# Patient Record
Sex: Female | Born: 1980 | Race: Black or African American | Hispanic: No | Marital: Single | Smoking: Never smoker
Health system: Southern US, Community
[De-identification: ages and names within clinical notes are randomized; demographics above are authoritative.]

## PROBLEM LIST (undated history)

## (undated) DIAGNOSIS — U071 COVID-19: Secondary | ICD-10-CM

## (undated) DIAGNOSIS — Z8489 Family history of other specified conditions: Secondary | ICD-10-CM

## (undated) DIAGNOSIS — R519 Headache, unspecified: Secondary | ICD-10-CM

## (undated) DIAGNOSIS — G4730 Sleep apnea, unspecified: Secondary | ICD-10-CM

## (undated) DIAGNOSIS — D219 Benign neoplasm of connective and other soft tissue, unspecified: Secondary | ICD-10-CM

## (undated) DIAGNOSIS — O24419 Gestational diabetes mellitus in pregnancy, unspecified control: Secondary | ICD-10-CM

## (undated) HISTORY — PX: BREAST FIBROADENOMA SURGERY: SHX580

## (undated) HISTORY — PX: DILATION AND CURETTAGE OF UTERUS: SHX78

## (undated) HISTORY — PX: LASIK: SHX215

## (undated) HISTORY — DX: Benign neoplasm of connective and other soft tissue, unspecified: D21.9

---

## 2002-04-05 HISTORY — PX: DILATION AND CURETTAGE OF UTERUS: SHX78

## 2016-04-05 HISTORY — PX: LASIK: SHX215

## 2018-04-05 DIAGNOSIS — O24419 Gestational diabetes mellitus in pregnancy, unspecified control: Secondary | ICD-10-CM

## 2018-04-05 HISTORY — DX: Gestational diabetes mellitus in pregnancy, unspecified control: O24.419

## 2018-04-19 LAB — OB RESULTS CONSOLE ABO/RH: RH Type: POSITIVE

## 2018-04-19 LAB — OB RESULTS CONSOLE HEPATITIS B SURFACE ANTIGEN: Hepatitis B Surface Ag: NEGATIVE

## 2018-04-19 LAB — OB RESULTS CONSOLE ANTIBODY SCREEN: Antibody Screen: NEGATIVE

## 2018-04-19 LAB — OB RESULTS CONSOLE GC/CHLAMYDIA
Chlamydia: NEGATIVE
Gonorrhea: NEGATIVE

## 2018-04-19 LAB — OB RESULTS CONSOLE HIV ANTIBODY (ROUTINE TESTING): HIV: NONREACTIVE

## 2018-04-19 LAB — OB RESULTS CONSOLE RUBELLA ANTIBODY, IGM: Rubella: IMMUNE

## 2018-04-19 LAB — OB RESULTS CONSOLE RPR: RPR: NONREACTIVE

## 2018-09-14 ENCOUNTER — Encounter: Payer: BC Managed Care – PPO | Attending: Certified Nurse Midwife | Admitting: Registered"

## 2018-09-14 ENCOUNTER — Other Ambulatory Visit: Payer: Self-pay

## 2018-09-14 ENCOUNTER — Encounter: Payer: Self-pay | Admitting: Registered"

## 2018-09-14 DIAGNOSIS — O24419 Gestational diabetes mellitus in pregnancy, unspecified control: Secondary | ICD-10-CM | POA: Insufficient documentation

## 2018-09-14 NOTE — Progress Notes (Signed)
Patient was seen on 09/14/2018 for Gestational Diabetes self-management education at the [facility]. The following learning objectives were met by the patient during this course:   States the definition of Gestational Diabetes  States why dietary management is important in controlling blood glucose  Describes the effects each nutrient has on blood glucose levels  Demonstrates ability to create a balanced meal plan  Demonstrates carbohydrate counting   States when to check blood glucose levels  Demonstrates proper blood glucose monitoring techniques  States the effect of stress and exercise on blood glucose levels  States the importance of limiting caffeine and abstaining from alcohol and smoking  Blood glucose monitor given: none  Patient instructed to monitor glucose levels: FBS: 60 - <95; 1 hour: <140; 2 hour: <120  Patient received handouts:  Nutrition Diabetes and Pregnancy, including carb counting list  Patient will be seen for follow-up as needed.

## 2018-09-15 ENCOUNTER — Encounter (HOSPITAL_COMMUNITY): Payer: Self-pay | Admitting: *Deleted

## 2018-09-15 ENCOUNTER — Inpatient Hospital Stay (HOSPITAL_COMMUNITY)
Admission: AD | Admit: 2018-09-15 | Discharge: 2018-09-15 | Disposition: A | Discharge status: Home / Self Care | Payer: BC Managed Care – PPO | Attending: Obstetrics & Gynecology | Admitting: Obstetrics & Gynecology

## 2018-09-15 ENCOUNTER — Other Ambulatory Visit: Payer: Self-pay

## 2018-09-15 DIAGNOSIS — Y9301 Activity, walking, marching and hiking: Secondary | ICD-10-CM | POA: Insufficient documentation

## 2018-09-15 DIAGNOSIS — O24410 Gestational diabetes mellitus in pregnancy, diet controlled: Secondary | ICD-10-CM | POA: Insufficient documentation

## 2018-09-15 DIAGNOSIS — Z91018 Allergy to other foods: Secondary | ICD-10-CM | POA: Diagnosis not present

## 2018-09-15 DIAGNOSIS — O9A219 Injury, poisoning and certain other consequences of external causes complicating pregnancy, unspecified trimester: Secondary | ICD-10-CM | POA: Diagnosis present

## 2018-09-15 DIAGNOSIS — W108XXA Fall (on) (from) other stairs and steps, initial encounter: Secondary | ICD-10-CM | POA: Diagnosis not present

## 2018-09-15 DIAGNOSIS — O9A213 Injury, poisoning and certain other consequences of external causes complicating pregnancy, third trimester: Secondary | ICD-10-CM

## 2018-09-15 DIAGNOSIS — Z91013 Allergy to seafood: Secondary | ICD-10-CM | POA: Diagnosis not present

## 2018-09-15 DIAGNOSIS — Z3A31 31 weeks gestation of pregnancy: Secondary | ICD-10-CM | POA: Diagnosis not present

## 2018-09-15 HISTORY — DX: Gestational diabetes mellitus in pregnancy, unspecified control: O24.419

## 2018-09-15 LAB — URINALYSIS, ROUTINE W REFLEX MICROSCOPIC
Bilirubin Urine: NEGATIVE
Glucose, UA: NEGATIVE mg/dL
Hgb urine dipstick: NEGATIVE
Ketones, ur: NEGATIVE mg/dL
Leukocytes,Ua: NEGATIVE
Nitrite: NEGATIVE
Protein, ur: NEGATIVE mg/dL
Specific Gravity, Urine: 1.027 (ref 1.005–1.030)
pH: 5 (ref 5.0–8.0)

## 2018-09-15 NOTE — Discharge Instructions (Signed)
You can take Tylenol 1000 mg by mouth every 6 hours as you need it for pain.

## 2018-09-15 NOTE — MAU Provider Note (Addendum)
History     CSN: [id number]  Arrival date and time: 09/15/18 1747   First Provider Initiated Contact with Patient 09/15/18 1846      Chief Complaint  Patient presents with  . Fall   Vickie Anderson is a 38 y.o. G1P0 at [redacted]w[redacted]d who presents today after a fall. She was walking and missed a step, and she landed on hard floor on her buttocks. She denies any VB, LOF or contractions since the fall. She reports normal fetal movement. She denies any complications with the pregnancy other than diet controlled GDM. Slight headache since the fall. She thinks this is from stress.   Fall The accident occurred 1 to 3 hours ago. The fall occurred while walking. She fell from a height of 3 to 5 ft. She landed on hard floor. There was no blood loss. The point of impact was the buttocks. The patient is experiencing no pain. Associated symptoms include headaches. Pertinent negatives include no fever, nausea or vomiting.    OB History    Gravida  1   Para      Term      Preterm      AB      Living        SAB      TAB      Ectopic      Multiple      Live Births              Past Medical History:  Diagnosis Date  . Gestational diabetes     Past Surgical History:  Procedure Laterality Date  . LASIK      History reviewed. No pertinent family history.  Social History   Tobacco Use  . Smoking status: Never Smoker  . Smokeless tobacco: Never Used  Substance Use Topics  . Alcohol use: Not Currently    Frequency: Never  . Drug use: Never    Allergies:  Allergies  Allergen Reactions  . Other Shortness Of Breath    Tree  Nuts and Peanuts  . Shellfish Allergy Diarrhea and Rash    No medications prior to admission.    Review of Systems  Constitutional: Negative for chills and fever.  Gastrointestinal: Negative for nausea and vomiting.  Genitourinary: Negative for pelvic pain, vaginal bleeding and vaginal discharge.  Neurological: Positive for headaches.   Physical  Exam   Blood pressure 124/61, pulse 90, temperature 98.1 F (36.7 C), temperature source Oral, resp. rate 18, height 5' 8.75" (1.746 m), weight 131.6 kg, SpO2 99 %.  Physical Exam  Nursing note and vitals reviewed. Constitutional: She is oriented to person, place, and time. She appears well-developed and well-nourished. No distress.  HENT:  Head: Normocephalic.  Cardiovascular: Normal rate.  Respiratory: Effort normal.  GI: Soft. There is no abdominal tenderness. There is no rebound.  Musculoskeletal:        General: No tenderness.  Neurological: She is oriented to person, place, and time.  Skin: Skin is warm and dry.  Psychiatric: She has a normal mood and affect.    NST:  Baseline: 145 Variability: moderate Accels: 15x15 Decels: none Toco: rare, patient not feeling -- none prior to d/c home  MAU Course  Procedures  MDM CCUA Prolonged NST Regular diet 7:52 PM care turned over Laury Deep, CNM  Marcille Buffy DNP, CNM  09/15/18  7:53 PM   2030: Patient feeling much better after eating dinner, feeling good (+) FM, no complaints of UC's or abdominal pain  2045: Ready to go home  Results for orders placed or performed during the hospital encounter of 09/15/18 (from the past 24 hour(s))  Urinalysis, Routine w reflex microscopic     Status: Abnormal   Collection Time: 09/15/18  7:48 PM  Result Value Ref Range   Color, Urine YELLOW YELLOW   APPearance HAZY (A) CLEAR   Specific Gravity, Urine 1.027 1.005 - 1.030   pH 5.0 5.0 - 8.0   Glucose, UA NEGATIVE NEGATIVE mg/dL   Hgb urine dipstick NEGATIVE NEGATIVE   Bilirubin Urine NEGATIVE NEGATIVE   Ketones, ur NEGATIVE NEGATIVE mg/dL   Protein, ur NEGATIVE NEGATIVE mg/dL   Nitrite NEGATIVE NEGATIVE   Leukocytes,Ua NEGATIVE NEGATIVE    Assessment and Plan  Traumatic injury during pregnancy in third trimester  - Plan: Discharge patient - Reviewed bleeding precautions - Keep scheduled appt with [facility] on  09/18/18   Laury Deep, CNM  09/15/2018 11:15 PM

## 2018-09-15 NOTE — MAU Note (Signed)
Slipped on steps, landed on butt (1630-2 steps).  No pain, bleeding or leaking.  +FM

## 2018-10-20 LAB — OB RESULTS CONSOLE GBS: GBS: POSITIVE

## 2018-10-27 ENCOUNTER — Other Ambulatory Visit: Payer: Self-pay

## 2018-10-30 ENCOUNTER — Telehealth (HOSPITAL_COMMUNITY): Payer: Self-pay | Admitting: *Deleted

## 2018-10-30 ENCOUNTER — Encounter (HOSPITAL_COMMUNITY): Payer: Self-pay

## 2018-10-30 ENCOUNTER — Encounter (HOSPITAL_COMMUNITY): Payer: Self-pay | Admitting: *Deleted

## 2018-10-30 NOTE — Telephone Encounter (Signed)
Preadmission screen  

## 2018-10-31 ENCOUNTER — Other Ambulatory Visit: Payer: Self-pay

## 2018-10-31 ENCOUNTER — Inpatient Hospital Stay (HOSPITAL_COMMUNITY)
Admission: AD | Admit: 2018-10-31 | Discharge: 2018-10-31 | Disposition: A | Discharge status: Home / Self Care | Payer: BC Managed Care – PPO | Attending: Obstetrics | Admitting: Obstetrics

## 2018-10-31 ENCOUNTER — Encounter (HOSPITAL_COMMUNITY): Payer: Self-pay

## 2018-10-31 DIAGNOSIS — O368320 Maternal care for abnormalities of the fetal heart rate or rhythm, second trimester, not applicable or unspecified: Secondary | ICD-10-CM | POA: Diagnosis not present

## 2018-10-31 DIAGNOSIS — O09523 Supervision of elderly multigravida, third trimester: Secondary | ICD-10-CM | POA: Insufficient documentation

## 2018-10-31 DIAGNOSIS — O368330 Maternal care for abnormalities of the fetal heart rate or rhythm, third trimester, not applicable or unspecified: Secondary | ICD-10-CM | POA: Diagnosis present

## 2018-10-31 DIAGNOSIS — Z3689 Encounter for other specified antenatal screening: Secondary | ICD-10-CM

## 2018-10-31 DIAGNOSIS — Z3A37 37 weeks gestation of pregnancy: Secondary | ICD-10-CM | POA: Diagnosis not present

## 2018-10-31 LAB — URINALYSIS, ROUTINE W REFLEX MICROSCOPIC
Bilirubin Urine: NEGATIVE
Glucose, UA: NEGATIVE mg/dL
Hgb urine dipstick: NEGATIVE
Ketones, ur: 5 mg/dL — AB
Leukocytes,Ua: NEGATIVE
Nitrite: NEGATIVE
Protein, ur: NEGATIVE mg/dL
Specific Gravity, Urine: 1.015 (ref 1.005–1.030)
pH: 6 (ref 5.0–8.0)

## 2018-10-31 NOTE — MAU Provider Note (Signed)
History     CSN: [id number]  Arrival date and time: 10/31/18 1702   First Provider Initiated Contact with Patient 10/31/18 1922      Chief Complaint  Patient presents with  . Non-stress Test    told to come after NST in office for further evaluation of baby's heart rate   HPI   Vickie Anderson is a 38 y.o. female G2P0010 @ 104w6d here in MAU from the office. She was told that the babies heart rate was elevated during her NST and did not come down to baseline. She says she was very uncomfortable in the position she was on in the bed and wonders if that made an impact on the babies HR. She is feeling regular fetal movements, no pain. No bleeding.   OB History    Gravida  2   Para      Term      Preterm      AB  1   Living        SAB      TAB  1   Ectopic      Multiple      Live Births              Past Medical History:  Diagnosis Date  . Fibroid   . Gestational diabetes     Past Surgical History:  Procedure Laterality Date  . DILATION AND CURETTAGE OF UTERUS    . LASIK      Family History  Problem Relation Age of Onset  . Diabetes Mother   . Diabetes Father     Social History   Tobacco Use  . Smoking status: Never Smoker  . Smokeless tobacco: Never Used  Substance Use Topics  . Alcohol use: Not Currently    Frequency: Never  . Drug use: Never    Allergies:  Allergies  Allergen Reactions  . Other Shortness Of Breath    Tree  Nuts and Peanuts  . Shellfish Allergy Diarrhea and Rash    Medications Prior to Admission  Medication Sig Dispense Refill Last Dose  . fluticasone (FLONASE) 50 MCG/ACT nasal spray Place into both nostrils daily.   Past Week at Unknown time  . loratadine (CLARITIN) 10 MG tablet Take 10 mg by mouth daily.   10/30/2018 at Unknown time  . metFORMIN (GLUCOPHAGE) 500 MG tablet Take 500 mg by mouth 2 (two) times daily with a meal.   10/30/2018 at Unknown time  . metroNIDAZOLE (FLAGYL) 500 MG tablet Take 500 mg by  mouth 3 (three) times daily.   10/31/2018 at Unknown time  . Prenatal Vit-Fe Fumarate-FA (MULTIVITAMIN-PRENATAL) 27-0.8 MG TABS tablet Take 1 tablet by mouth daily at 12 noon.   10/30/2018 at Unknown time   Results for orders placed or performed during the hospital encounter of 10/31/18 (from the past 48 hour(s))  Urinalysis, Routine w reflex microscopic     Status: Abnormal   Collection Time: 10/31/18  6:14 PM  Result Value Ref Range   Color, Urine YELLOW YELLOW   APPearance HAZY (A) CLEAR   Specific Gravity, Urine 1.015 1.005 - 1.030   pH 6.0 5.0 - 8.0   Glucose, UA NEGATIVE NEGATIVE mg/dL   Hgb urine dipstick NEGATIVE NEGATIVE   Bilirubin Urine NEGATIVE NEGATIVE   Ketones, ur 5 (A) NEGATIVE mg/dL   Protein, ur NEGATIVE NEGATIVE mg/dL   Nitrite NEGATIVE NEGATIVE   Leukocytes,Ua NEGATIVE NEGATIVE    Comment: Performed at [facility], [address] [place], [place] [address]   Review of Systems  Gastrointestinal: Negative for abdominal pain.  Genitourinary: Negative for vaginal bleeding and vaginal discharge.   Physical Exam   Blood pressure 126/73, pulse 84, temperature 98 F (36.7 C), resp. rate 12.  Physical Exam  Constitutional: She is oriented to person, place, and time. She appears well-developed and well-nourished. No distress.  Musculoskeletal: Normal range of motion.  Neurological: She is alert and oriented to person, place, and time.  Skin: Skin is warm. She is not diaphoretic.  Psychiatric: Her behavior is normal.   Fetal Tracing: Baseline: 140 bpm Variability: Moderate  Accelerations: 15x15 Decelerations: None Toco: None  MAU Course  Procedures  None  MDM  Grossly reactive NST today with normal baseline.   Assessment and Plan   A:  1. NST (non-stress test) reactive   2. [redacted] weeks gestation of pregnancy     P:  Discharge home in stable condition NST reactive Message sent to the office for follow-up Fetal kick counts  Return to MAU if  symptoms worsen  Lora Glomski, Artist Pais, NP 10/31/2018 7:36 PM

## 2018-10-31 NOTE — Discharge Instructions (Signed)
Nonstress Test °A nonstress test is a procedure that is done during pregnancy in order to check the baby's heartbeat. The procedure can help show if the baby (fetus) is healthy. It is commonly done when: °· The baby is past his or her due date. °· The pregnancy is high risk. °· The baby is moving less than normal. °· The mother has lost a pregnancy in the past. °· The health care provider suspects a problem with the baby's growth. °· There is too much or too little amniotic fluid. °The procedure is often done in the third trimester of pregnancy to find out if an early delivery is needed and whether such a delivery is safe. °During a nonstress test, the baby's heartbeat is monitored when the baby is resting and when the baby is moving. If the baby is healthy, the heart rate will increase when he or she moves or kicks and will return to normal when he or she rests. °Tell a health care provider about: °· Any allergies you have. °· Any medical conditions you have. °· All medicines you are taking, including vitamins, herbs, eye drops, creams, and over-the-counter medicines. °What are the risks? °There are no risks to you or your baby from a nonstress test. This procedure should not be painful or uncomfortable. °What happens before the procedure? °· Eat a meal right before the test or as directed by your health care provider. Food may help encourage the baby to move. °· Use the restroom right before the test. °What happens during the procedure? °· Two monitors will be placed on your abdomen. One will record the baby's heart rate and the other will record the contractions of your uterus. °· You may be asked to lie down on your side or to sit upright. °· You may be given a button to press when you feel your baby move. °· Your health care provider will listen to your baby's heartbeat and recorded it. He or she may also watch your baby's heartbeat on a screen. °· If the baby seems to be sleeping, you may be asked to drink  some juice or soda, eat a snack, or change positions. °The procedure may vary among health care providers and hospitals. °What happens after the procedure? °· Your health care provider will discuss the test results with you and make recommendations for the future. Depending on the results, your health care provider may order additional tests or another course of action. °· If your health care provider gave you any diet or activity instructions, make sure to follow them. °· Keep all follow-up visits as told by your health care provider. This is important. °Summary °· A nonstress test is a procedure that is done during pregnancy in order to check the baby's heartbeat. The procedure can help show if the baby is healthy. °· The procedure is often done in the third trimester of pregnancy to find out if an early delivery is needed and whether such a delivery is safe. °· During a nonstress test, the baby's heartbeat is monitored when the baby is resting and when the baby is moving. If the baby is healthy, the heart rate will increase when he or she moves or kicks and will return to normal when he or she rests. °· Your health care provider will discuss the test results with you and make recommendations for the future. °This information is not intended to replace advice given to you by your health care provider. Make sure you discuss any   questions you have with your health care provider. Document Released: 03/12/2002 Document Revised: 07/01/2016 Document Reviewed: 07/01/2016 Elsevier Patient Education  [place].

## 2018-10-31 NOTE — MAU Note (Signed)
Pt states she was having an NST in the office that was scheduled and was told that her baby's heart rate was too high for too long. Pt states that during the NST the baby's heart rate was too hight the entire time.   Pt states she was given an u/s and her midwife did not really comment on the u/s just that she felt like it would be a good idea to come in and be evaluated further.   Reports +FM   Denies vaginal bleeding or LOF.

## 2018-11-03 ENCOUNTER — Other Ambulatory Visit: Payer: Self-pay

## 2018-11-03 ENCOUNTER — Ambulatory Visit (HOSPITAL_COMMUNITY)
Admission: RE | Admit: 2018-11-03 | Discharge: 2018-11-03 | Disposition: A | Discharge status: Home / Self Care | Payer: BC Managed Care – PPO | Source: Ambulatory Visit | Attending: Obstetrics and Gynecology | Admitting: Obstetrics and Gynecology

## 2018-11-03 ENCOUNTER — Encounter (HOSPITAL_COMMUNITY): Payer: Self-pay

## 2018-11-03 ENCOUNTER — Other Ambulatory Visit (HOSPITAL_COMMUNITY): Payer: Self-pay | Admitting: Obstetrics & Gynecology

## 2018-11-03 ENCOUNTER — Ambulatory Visit (HOSPITAL_COMMUNITY): Payer: BC Managed Care – PPO | Admitting: *Deleted

## 2018-11-03 DIAGNOSIS — O3413 Maternal care for benign tumor of corpus uteri, third trimester: Secondary | ICD-10-CM | POA: Diagnosis not present

## 2018-11-03 DIAGNOSIS — O9A213 Injury, poisoning and certain other consequences of external causes complicating pregnancy, third trimester: Secondary | ICD-10-CM

## 2018-11-03 DIAGNOSIS — O09523 Supervision of elderly multigravida, third trimester: Secondary | ICD-10-CM | POA: Diagnosis not present

## 2018-11-03 DIAGNOSIS — Z3A38 38 weeks gestation of pregnancy: Secondary | ICD-10-CM

## 2018-11-03 DIAGNOSIS — O4593 Premature separation of placenta, unspecified, third trimester: Secondary | ICD-10-CM

## 2018-11-03 DIAGNOSIS — D259 Leiomyoma of uterus, unspecified: Secondary | ICD-10-CM

## 2018-11-03 DIAGNOSIS — O24415 Gestational diabetes mellitus in pregnancy, controlled by oral hypoglycemic drugs: Secondary | ICD-10-CM

## 2018-11-08 ENCOUNTER — Other Ambulatory Visit: Payer: Self-pay

## 2018-11-08 ENCOUNTER — Other Ambulatory Visit (HOSPITAL_COMMUNITY)
Admission: RE | Admit: 2018-11-08 | Discharge: 2018-11-08 | Disposition: A | Discharge status: Home / Self Care | Payer: BC Managed Care – PPO | Source: Ambulatory Visit | Attending: Obstetrics and Gynecology | Admitting: Obstetrics and Gynecology

## 2018-11-08 DIAGNOSIS — Z01812 Encounter for preprocedural laboratory examination: Secondary | ICD-10-CM | POA: Diagnosis not present

## 2018-11-08 DIAGNOSIS — Z20828 Contact with and (suspected) exposure to other viral communicable diseases: Secondary | ICD-10-CM | POA: Insufficient documentation

## 2018-11-08 LAB — SARS CORONAVIRUS 2 (TAT 6-24 HRS): SARS Coronavirus 2: NEGATIVE

## 2018-11-08 NOTE — MAU Note (Signed)
Ovid swab collected.PT tolerated well.PT asymptomatic

## 2018-11-10 ENCOUNTER — Inpatient Hospital Stay (HOSPITAL_COMMUNITY): Payer: BC Managed Care – PPO | Admitting: Anesthesiology

## 2018-11-10 ENCOUNTER — Other Ambulatory Visit: Payer: Self-pay

## 2018-11-10 ENCOUNTER — Inpatient Hospital Stay (HOSPITAL_COMMUNITY)
Admission: AD | Admit: 2018-11-10 | Discharge: 2018-11-13 | DRG: 788 | Disposition: A | Discharge status: Home / Self Care | Payer: BC Managed Care – PPO | Attending: Obstetrics & Gynecology | Admitting: Obstetrics & Gynecology

## 2018-11-10 ENCOUNTER — Encounter (HOSPITAL_COMMUNITY): Payer: Self-pay | Admitting: *Deleted

## 2018-11-10 ENCOUNTER — Inpatient Hospital Stay (HOSPITAL_COMMUNITY): Payer: BC Managed Care – PPO

## 2018-11-10 ENCOUNTER — Encounter (HOSPITAL_COMMUNITY)
Admission: AD | Disposition: A | Discharge status: Home / Self Care | Payer: Self-pay | Source: Home / Self Care | Attending: Obstetrics & Gynecology

## 2018-11-10 DIAGNOSIS — O99214 Obesity complicating childbirth: Secondary | ICD-10-CM | POA: Diagnosis present

## 2018-11-10 DIAGNOSIS — D259 Leiomyoma of uterus, unspecified: Secondary | ICD-10-CM | POA: Diagnosis present

## 2018-11-10 DIAGNOSIS — O4593 Premature separation of placenta, unspecified, third trimester: Principal | ICD-10-CM | POA: Diagnosis present

## 2018-11-10 DIAGNOSIS — O3413 Maternal care for benign tumor of corpus uteri, third trimester: Secondary | ICD-10-CM | POA: Diagnosis present

## 2018-11-10 DIAGNOSIS — R339 Retention of urine, unspecified: Secondary | ICD-10-CM | POA: Diagnosis not present

## 2018-11-10 DIAGNOSIS — O9A213 Injury, poisoning and certain other consequences of external causes complicating pregnancy, third trimester: Secondary | ICD-10-CM

## 2018-11-10 DIAGNOSIS — O24425 Gestational diabetes mellitus in childbirth, controlled by oral hypoglycemic drugs: Secondary | ICD-10-CM | POA: Diagnosis present

## 2018-11-10 DIAGNOSIS — O99824 Streptococcus B carrier state complicating childbirth: Secondary | ICD-10-CM | POA: Diagnosis present

## 2018-11-10 DIAGNOSIS — O9089 Other complications of the puerperium, not elsewhere classified: Secondary | ICD-10-CM | POA: Diagnosis not present

## 2018-11-10 DIAGNOSIS — Z3A39 39 weeks gestation of pregnancy: Secondary | ICD-10-CM

## 2018-11-10 DIAGNOSIS — Z3490 Encounter for supervision of normal pregnancy, unspecified, unspecified trimester: Secondary | ICD-10-CM | POA: Diagnosis present

## 2018-11-10 DIAGNOSIS — O4590 Premature separation of placenta, unspecified, unspecified trimester: Secondary | ICD-10-CM | POA: Clinically undetermined

## 2018-11-10 LAB — CBC
HCT: 35.6 % — ABNORMAL LOW (ref 36.0–46.0)
Hemoglobin: 11.8 g/dL — ABNORMAL LOW (ref 12.0–15.0)
MCH: 29.1 pg (ref 26.0–34.0)
MCHC: 33.1 g/dL (ref 30.0–36.0)
MCV: 87.7 fL (ref 80.0–100.0)
Platelets: 288 10*3/uL (ref 150–400)
RBC: 4.06 MIL/uL (ref 3.87–5.11)
RDW: 13.7 % (ref 11.5–15.5)
WBC: 9.8 10*3/uL (ref 4.0–10.5)
nRBC: 0 % (ref 0.0–0.2)

## 2018-11-10 LAB — PREPARE RBC (CROSSMATCH)

## 2018-11-10 LAB — GLUCOSE, CAPILLARY
Glucose-Capillary: 84 mg/dL (ref 70–99)
Glucose-Capillary: 97 mg/dL (ref 70–99)
Glucose-Capillary: 124 mg/dL — ABNORMAL HIGH (ref 70–99)
Glucose-Capillary: 128 mg/dL — ABNORMAL HIGH (ref 70–99)
Glucose-Capillary: 84 mg/dL (ref 70–99)
Glucose-Capillary: 95 mg/dL (ref 70–99)

## 2018-11-10 LAB — RPR: RPR Ser Ql: NONREACTIVE

## 2018-11-10 SURGERY — Surgical Case
Anesthesia: Epidural | Site: Abdomen | Wound class: Clean Contaminated

## 2018-11-10 MED ORDER — FENTANYL CITRATE (PF) 100 MCG/2ML IJ SOLN: 50.0000 ug | INTRAMUSCULAR | Status: DC | PRN

## 2018-11-10 MED ORDER — LACTATED RINGERS IV SOLN
INTRAVENOUS | Status: DC
  Administered 2018-11-10: 01:00:00 via INTRAVENOUS

## 2018-11-10 MED ORDER — SOD CITRATE-CITRIC ACID 500-334 MG/5ML PO SOLN
30.0000 mL | ORAL | Status: DC | PRN
  Administered 2018-11-10: 30 mL via ORAL
  Filled 2018-11-10: qty 30

## 2018-11-10 MED ORDER — SODIUM CHLORIDE (PF) 0.9 % IJ SOLN
INTRAMUSCULAR | Status: DC | PRN
  Administered 2018-11-10: 12 mL/h via EPIDURAL

## 2018-11-10 MED ORDER — COCONUT OIL OIL: 1.0000 "application " | TOPICAL_OIL | Status: DC | PRN

## 2018-11-10 MED ORDER — STERILE WATER FOR IRRIGATION IR SOLN
Status: DC | PRN
  Administered 2018-11-10: 1000 mL

## 2018-11-10 MED ORDER — LACTATED RINGERS IV SOLN
INTRAVENOUS | Status: DC | PRN
  Administered 2018-11-10 (×2): via INTRAVENOUS

## 2018-11-10 MED ORDER — FENTANYL CITRATE (PF) 100 MCG/2ML IJ SOLN
INTRAMUSCULAR | Status: AC
  Filled 2018-11-10: qty 2

## 2018-11-10 MED ORDER — IBUPROFEN 800 MG PO TABS
800.0000 mg | ORAL_TABLET | Freq: Three times a day (TID) | ORAL | Status: DC
  Administered 2018-11-11 – 2018-11-13 (×7): 800 mg via ORAL
  Filled 2018-11-10 (×7): qty 1

## 2018-11-10 MED ORDER — MENTHOL 3 MG MT LOZG: 1.0000 | LOZENGE | OROMUCOSAL | Status: DC | PRN

## 2018-11-10 MED ORDER — DIPHENHYDRAMINE HCL 50 MG/ML IJ SOLN: 12.5000 mg | INTRAMUSCULAR | Status: DC | PRN

## 2018-11-10 MED ORDER — MISOPROSTOL 50MCG HALF TABLET
50.0000 ug | ORAL_TABLET | ORAL | Status: DC | PRN
  Administered 2018-11-10: 50 ug via BUCCAL
  Filled 2018-11-10: qty 1

## 2018-11-10 MED ORDER — METOCLOPRAMIDE HCL 5 MG/ML IJ SOLN
INTRAMUSCULAR | Status: DC | PRN
  Administered 2018-11-10: 10 mg via INTRAVENOUS

## 2018-11-10 MED ORDER — FENTANYL-BUPIVACAINE-NACL 0.5-0.125-0.9 MG/250ML-% EP SOLN
12.0000 mL/h | EPIDURAL | Status: DC | PRN
  Filled 2018-11-10: qty 250

## 2018-11-10 MED ORDER — NALOXONE HCL 0.4 MG/ML IJ SOLN: 0.4000 mg | INTRAMUSCULAR | Status: DC | PRN

## 2018-11-10 MED ORDER — SODIUM CHLORIDE 0.9 % IV SOLN
5.0000 10*6.[IU] | Freq: Once | INTRAVENOUS | Status: AC
  Administered 2018-11-10: 5 10*6.[IU] via INTRAVENOUS
  Filled 2018-11-10: qty 5

## 2018-11-10 MED ORDER — ONDANSETRON HCL 4 MG/2ML IJ SOLN: 4.0000 mg | Freq: Four times a day (QID) | INTRAMUSCULAR | Status: DC | PRN

## 2018-11-10 MED ORDER — LACTATED RINGERS IV SOLN
INTRAVENOUS | Status: DC
  Administered 2018-11-11: via INTRAVENOUS

## 2018-11-10 MED ORDER — MEPERIDINE HCL 25 MG/ML IJ SOLN: 6.2500 mg | INTRAMUSCULAR | Status: DC | PRN

## 2018-11-10 MED ORDER — DIPHENHYDRAMINE HCL 25 MG PO CAPS
25.0000 mg | ORAL_CAPSULE | Freq: Four times a day (QID) | ORAL | Status: DC | PRN
  Administered 2018-11-11: 25 mg via ORAL
  Filled 2018-11-10: qty 1

## 2018-11-10 MED ORDER — OXYTOCIN 40 UNITS IN NORMAL SALINE INFUSION - SIMPLE MED
INTRAVENOUS | Status: AC
  Filled 2018-11-10: qty 1000

## 2018-11-10 MED ORDER — PRENATAL MULTIVITAMIN CH
1.0000 | ORAL_TABLET | Freq: Every day | ORAL | Status: DC
  Administered 2018-11-11 – 2018-11-12 (×2): 1 via ORAL
  Filled 2018-11-10 (×2): qty 1

## 2018-11-10 MED ORDER — SIMETHICONE 80 MG PO CHEW
80.0000 mg | CHEWABLE_TABLET | Freq: Three times a day (TID) | ORAL | Status: DC
  Administered 2018-11-10 – 2018-11-13 (×6): 80 mg via ORAL
  Filled 2018-11-10 (×7): qty 1

## 2018-11-10 MED ORDER — CEFAZOLIN SODIUM-DEXTROSE 2-3 GM-%(50ML) IV SOLR
INTRAVENOUS | Status: DC | PRN
  Administered 2018-11-10: 2 g via INTRAVENOUS

## 2018-11-10 MED ORDER — FENTANYL-BUPIVACAINE-NACL 0.5-0.125-0.9 MG/250ML-% EP SOLN: 12.0000 mL/h | EPIDURAL | Status: DC | PRN

## 2018-11-10 MED ORDER — OXYTOCIN 40 UNITS IN NORMAL SALINE INFUSION - SIMPLE MED: 2.5000 [IU]/h | INTRAVENOUS | Status: DC

## 2018-11-10 MED ORDER — ACETAMINOPHEN 500 MG PO TABS
1000.0000 mg | ORAL_TABLET | Freq: Four times a day (QID) | ORAL | Status: DC
  Administered 2018-11-10 – 2018-11-13 (×10): 1000 mg via ORAL
  Filled 2018-11-10 (×10): qty 2

## 2018-11-10 MED ORDER — FENTANYL CITRATE (PF) 100 MCG/2ML IJ SOLN
INTRAMUSCULAR | Status: DC | PRN
  Administered 2018-11-10: 100 ug via EPIDURAL

## 2018-11-10 MED ORDER — ZOLPIDEM TARTRATE 5 MG PO TABS
5.0000 mg | ORAL_TABLET | Freq: Every evening | ORAL | Status: DC | PRN
  Administered 2018-11-10: 5 mg via ORAL
  Filled 2018-11-10: qty 1

## 2018-11-10 MED ORDER — LACTATED RINGERS IV SOLN: 500.0000 mL | INTRAVENOUS | Status: DC | PRN

## 2018-11-10 MED ORDER — MISOPROSTOL 50MCG HALF TABLET
50.0000 ug | ORAL_TABLET | Freq: Four times a day (QID) | ORAL | Status: DC | PRN
  Administered 2018-11-10: 50 ug via BUCCAL
  Filled 2018-11-10: qty 1

## 2018-11-10 MED ORDER — METOCLOPRAMIDE HCL 5 MG/ML IJ SOLN
INTRAMUSCULAR | Status: AC
  Filled 2018-11-10: qty 2

## 2018-11-10 MED ORDER — KETOROLAC TROMETHAMINE 30 MG/ML IJ SOLN
30.0000 mg | Freq: Four times a day (QID) | INTRAMUSCULAR | Status: AC | PRN
  Administered 2018-11-10: 30 mg via INTRAMUSCULAR

## 2018-11-10 MED ORDER — DIPHENHYDRAMINE HCL 25 MG PO CAPS: 25.0000 mg | ORAL_CAPSULE | ORAL | Status: DC | PRN

## 2018-11-10 MED ORDER — FENTANYL CITRATE (PF) 100 MCG/2ML IJ SOLN: 25.0000 ug | INTRAMUSCULAR | Status: DC | PRN

## 2018-11-10 MED ORDER — SENNOSIDES-DOCUSATE SODIUM 8.6-50 MG PO TABS
2.0000 | ORAL_TABLET | ORAL | Status: DC
  Administered 2018-11-11 – 2018-11-12 (×2): 2 via ORAL
  Filled 2018-11-10 (×2): qty 2

## 2018-11-10 MED ORDER — ONDANSETRON HCL 4 MG/2ML IJ SOLN
4.0000 mg | Freq: Three times a day (TID) | INTRAMUSCULAR | Status: DC | PRN
  Administered 2018-11-10: 22:00:00 4 mg via INTRAVENOUS
  Filled 2018-11-10: qty 2

## 2018-11-10 MED ORDER — PHENYLEPHRINE 40 MCG/ML (10ML) SYRINGE FOR IV PUSH (FOR BLOOD PRESSURE SUPPORT): 80.0000 ug | PREFILLED_SYRINGE | INTRAVENOUS | Status: DC | PRN

## 2018-11-10 MED ORDER — NALBUPHINE HCL 10 MG/ML IJ SOLN: 5.0000 mg | Freq: Once | INTRAMUSCULAR | Status: DC | PRN

## 2018-11-10 MED ORDER — KETOROLAC TROMETHAMINE 30 MG/ML IJ SOLN: 30.0000 mg | Freq: Four times a day (QID) | INTRAMUSCULAR | Status: AC | PRN

## 2018-11-10 MED ORDER — DEXAMETHASONE SODIUM PHOSPHATE 10 MG/ML IJ SOLN
INTRAMUSCULAR | Status: DC | PRN
  Administered 2018-11-10: 5 mg via INTRAVENOUS

## 2018-11-10 MED ORDER — MORPHINE SULFATE (PF) 0.5 MG/ML IJ SOLN
INTRAMUSCULAR | Status: DC | PRN
  Administered 2018-11-10: 3 mg via EPIDURAL

## 2018-11-10 MED ORDER — DIBUCAINE (PERIANAL) 1 % EX OINT: 1.0000 "application " | TOPICAL_OINTMENT | CUTANEOUS | Status: DC | PRN

## 2018-11-10 MED ORDER — SODIUM CHLORIDE 0.9% IV SOLUTION: Freq: Once | INTRAVENOUS | Status: DC

## 2018-11-10 MED ORDER — SODIUM CHLORIDE 0.9 % IR SOLN
Status: DC | PRN
  Administered 2018-11-10: 1000 mL

## 2018-11-10 MED ORDER — TETANUS-DIPHTH-ACELL PERTUSSIS 5-2.5-18.5 LF-MCG/0.5 IM SUSP: 0.5000 mL | Freq: Once | INTRAMUSCULAR | Status: DC

## 2018-11-10 MED ORDER — EPHEDRINE 5 MG/ML INJ: 10.0000 mg | INTRAVENOUS | Status: DC | PRN

## 2018-11-10 MED ORDER — PENICILLIN G 3 MILLION UNITS IVPB - SIMPLE MED
3.0000 10*6.[IU] | INTRAVENOUS | Status: DC
  Administered 2018-11-10 (×2): 3 10*6.[IU] via INTRAVENOUS
  Filled 2018-11-10 (×2): qty 100

## 2018-11-10 MED ORDER — SIMETHICONE 80 MG PO CHEW: 80.0000 mg | CHEWABLE_TABLET | ORAL | Status: DC | PRN

## 2018-11-10 MED ORDER — SODIUM CHLORIDE 0.9 % IV SOLN
INTRAVENOUS | Status: DC | PRN
  Administered 2018-11-10: 13:00:00 via INTRAVENOUS

## 2018-11-10 MED ORDER — OXYCODONE HCL 5 MG PO TABS: 5.0000 mg | ORAL_TABLET | ORAL | Status: DC | PRN

## 2018-11-10 MED ORDER — NALBUPHINE HCL 10 MG/ML IJ SOLN: 5.0000 mg | INTRAMUSCULAR | Status: DC | PRN

## 2018-11-10 MED ORDER — CEFAZOLIN SODIUM-DEXTROSE 2-4 GM/100ML-% IV SOLN
INTRAVENOUS | Status: AC
  Filled 2018-11-10: qty 100

## 2018-11-10 MED ORDER — DEXAMETHASONE SODIUM PHOSPHATE 4 MG/ML IJ SOLN
INTRAMUSCULAR | Status: AC
  Filled 2018-11-10: qty 1

## 2018-11-10 MED ORDER — SODIUM BICARBONATE 8.4 % IV SOLN
INTRAVENOUS | Status: DC | PRN
  Administered 2018-11-10: 10 mL via EPIDURAL

## 2018-11-10 MED ORDER — LIDOCAINE HCL (PF) 1 % IJ SOLN: 30.0000 mL | INTRAMUSCULAR | Status: DC | PRN

## 2018-11-10 MED ORDER — ONDANSETRON HCL 4 MG/2ML IJ SOLN
INTRAMUSCULAR | Status: AC
  Filled 2018-11-10: qty 2

## 2018-11-10 MED ORDER — OXYTOCIN BOLUS FROM INFUSION: 500.0000 mL | Freq: Once | INTRAVENOUS | Status: DC

## 2018-11-10 MED ORDER — LACTATED RINGERS IV SOLN
500.0000 mL | Freq: Once | INTRAVENOUS | Status: AC
  Administered 2018-11-10: 500 mL via INTRAVENOUS

## 2018-11-10 MED ORDER — DEXAMETHASONE SODIUM PHOSPHATE 10 MG/ML IJ SOLN
INTRAMUSCULAR | Status: AC
  Filled 2018-11-10: qty 1

## 2018-11-10 MED ORDER — OXYTOCIN 40 UNITS IN NORMAL SALINE INFUSION - SIMPLE MED: 2.5000 [IU]/h | INTRAVENOUS | Status: AC

## 2018-11-10 MED ORDER — WITCH HAZEL-GLYCERIN EX PADS: 1.0000 "application " | MEDICATED_PAD | CUTANEOUS | Status: DC | PRN

## 2018-11-10 MED ORDER — TERBUTALINE SULFATE 1 MG/ML IJ SOLN: 0.2500 mg | Freq: Once | INTRAMUSCULAR | Status: DC | PRN

## 2018-11-10 MED ORDER — MORPHINE SULFATE (PF) 0.5 MG/ML IJ SOLN
INTRAMUSCULAR | Status: AC
  Filled 2018-11-10: qty 10

## 2018-11-10 MED ORDER — KETOROLAC TROMETHAMINE 30 MG/ML IJ SOLN
INTRAMUSCULAR | Status: AC
  Filled 2018-11-10: qty 1

## 2018-11-10 MED ORDER — NALOXONE HCL 4 MG/10ML IJ SOLN
1.0000 ug/kg/h | INTRAVENOUS | Status: DC | PRN
  Filled 2018-11-10: qty 5

## 2018-11-10 MED ORDER — SODIUM CHLORIDE 0.9 % IV SOLN
INTRAVENOUS | Status: DC | PRN
  Administered 2018-11-10: 40 [IU] via INTRAVENOUS

## 2018-11-10 MED ORDER — ONDANSETRON HCL 4 MG/2ML IJ SOLN
INTRAMUSCULAR | Status: DC | PRN
  Administered 2018-11-10: 4 mg via INTRAVENOUS

## 2018-11-10 MED ORDER — ZOLPIDEM TARTRATE 5 MG PO TABS: 5.0000 mg | ORAL_TABLET | Freq: Every evening | ORAL | Status: DC | PRN

## 2018-11-10 MED ORDER — CEFAZOLIN SODIUM-DEXTROSE 1-4 GM/50ML-% IV SOLN: INTRAVENOUS | Status: DC | PRN

## 2018-11-10 MED ORDER — SIMETHICONE 80 MG PO CHEW
80.0000 mg | CHEWABLE_TABLET | ORAL | Status: DC
  Administered 2018-11-11 – 2018-11-12 (×2): 80 mg via ORAL
  Filled 2018-11-10 (×2): qty 1

## 2018-11-10 MED ORDER — SODIUM CHLORIDE 0.9% FLUSH: 3.0000 mL | INTRAVENOUS | Status: DC | PRN

## 2018-11-10 MED ORDER — ACETAMINOPHEN 325 MG PO TABS: 650.0000 mg | ORAL_TABLET | ORAL | Status: DC | PRN

## 2018-11-10 MED ORDER — LIDOCAINE HCL (PF) 1 % IJ SOLN
INTRAMUSCULAR | Status: DC | PRN
  Administered 2018-11-10: 6 mL via EPIDURAL

## 2018-11-10 SURGICAL SUPPLY — 29 items
BARRIER ADHS 3X4 INTERCEED (GAUZE/BANDAGES/DRESSINGS) ×2 IMPLANT
BENZOIN TINCTURE PRP APPL 2/3 (GAUZE/BANDAGES/DRESSINGS) ×2 IMPLANT
CHLORAPREP W/TINT 26ML (MISCELLANEOUS) ×3 IMPLANT
CLAMP CORD UMBIL (MISCELLANEOUS) ×2 IMPLANT
CLOSURE WOUND 1/2 X4 (GAUZE/BANDAGES/DRESSINGS) ×1
CLOTH BEACON ORANGE TIMEOUT ST (SAFETY) ×3 IMPLANT
DRSG OPSITE POSTOP 4X10 (GAUZE/BANDAGES/DRESSINGS) ×3 IMPLANT
ELECT REM PT RETURN 9FT ADLT (ELECTROSURGICAL) ×3
ELECTRODE REM PT RTRN 9FT ADLT (ELECTROSURGICAL) ×1 IMPLANT
GLOVE BIO SURGEON STRL SZ7 (GLOVE) ×3 IMPLANT
GLOVE BIOGEL PI IND STRL 7.0 (GLOVE) ×2 IMPLANT
GLOVE BIOGEL PI INDICATOR 7.0 (GLOVE) ×4
GOWN STRL REUS W/TWL LRG LVL3 (GOWN DISPOSABLE) ×6 IMPLANT
NS IRRIG 1000ML POUR BTL (IV SOLUTION) ×3 IMPLANT
PACK C SECTION WH (CUSTOM PROCEDURE TRAY) ×3 IMPLANT
PAD OB MATERNITY 4.3X12.25 (PERSONAL CARE ITEMS) ×3 IMPLANT
RTRCTR C-SECT PINK 25CM LRG (MISCELLANEOUS) ×2 IMPLANT
STRIP CLOSURE SKIN 1/2X4 (GAUZE/BANDAGES/DRESSINGS) ×1 IMPLANT
SUT MNCRL 0 VIOLET CTX 36 (SUTURE) ×2 IMPLANT
SUT MONOCRYL 0 CTX 36 (SUTURE) ×4
SUT PLAIN 2 0 (SUTURE) ×2
SUT PLAIN ABS 2-0 CT1 27XMFL (SUTURE) IMPLANT
SUT VIC AB 0 CT1 27 (SUTURE) ×4
SUT VIC AB 0 CT1 27XBRD ANBCTR (SUTURE) ×2 IMPLANT
SUT VIC AB 2-0 CT1 27 (SUTURE) ×2
SUT VIC AB 2-0 CT1 TAPERPNT 27 (SUTURE) ×1 IMPLANT
SUT VIC AB 4-0 KS 27 (SUTURE) ×3 IMPLANT
TOWEL OR 17X24 6PK STRL BLUE (TOWEL DISPOSABLE) ×3 IMPLANT
WATER STERILE IRR 1000ML POUR (IV SOLUTION) ×3 IMPLANT

## 2018-11-10 NOTE — Lactation Note (Signed)
This note was copied from a baby's chart. Lactation Consultation Note  Patient Name: Vickie Anderson Today's Date: 11/10/2018   P1, 8 hour female infant. LC entered the room. Nurse with mom and mom is having emesis (vomiting) [place] informed mom she would return later.   Maternal Data    Feeding Feeding Type: Breast Fed  LATCH Score Latch: Grasps breast easily, tongue down, lips flanged, rhythmical sucking.  Audible Swallowing: A few with stimulation  Type of Nipple: Everted at rest and after stimulation  Comfort (Breast/Nipple): Soft / non-tender  Hold (Positioning): Assistance needed to correctly position infant at breast and maintain latch.  LATCH Score: 8  Interventions    Lactation Tools Discussed/Used     Consult Status      [name] 11/10/2018, 9:55 PM

## 2018-11-10 NOTE — H&P (Addendum)
OB ADMISSION/ HISTORY & PHYSICAL:  Admission Date: 11/10/2018 12:01 AM  Admit Diagnosis: 39+2 weeks IOL A2GDM and AMA  Vickie Anderson is a 38 y.o. female G2P0 at 39+2 weeks presenting for induction of labor for A2 GDM and AMA.  Prenatal History: G2P0010   EDC : 11/15/2018, by Last Menstrual Period  Prenatal care at [facility] since 10 weeks Primary Ob Provider: CNM Management/T. Mel Almond, CNM  Prenatal course complicated by  - AMA - Large uterine fibroids: last measured on 7/31    Left: 4.9x4.3x5.6cm - intramural    Right: 8.3x5.7x7.4cm - intramural, subplacental - A2GDM on Metformin 500mg  QHS with biweekly ANFT - s/p MFM referral on 7/31 to r/o placental abruption: none identified per MFM, and given no s/s, pt. D/c'd home  - Obesity: pre-pregnant BMI 30  Prenatal Labs: ABO, Rh: --/--/PENDING (08/07 0009) A Positive  Antibody: PENDING (08/07 0009) Rubella: Immune (01/15 0000)  RPR: Nonreactive (01/15 0000)  HBsAg: Negative (01/15 0000)  HIV: Non-reactive (01/15 0000)  GTT: failed 3hr A2 GDM on Metformin  GBS: Positive (07/17 0000)  CUS: normal anatomy, XX, posterior placenta  Serial growth sonos for fibroids: last growth at 37 weeks (7/23) AGA 7#4oz @82 %, AC @53 %, HC @55 % AFI WNL, fibroids stable Panorama: low risk, female AFP-1 negative   Medical / Surgical History :  Past medical history:  Past Medical History:  Diagnosis Date  . Fibroid   . Gestational diabetes      Past surgical history:  Past Surgical History:  Procedure Laterality Date  . BREAST FIBROADENOMA SURGERY    . DILATION AND CURETTAGE OF UTERUS    . LASIK      Family History:  Family History  Problem Relation Age of Onset  . Diabetes Mother   . Diabetes Father      Social History:  reports that she has never smoked. She has never used smokeless tobacco. She reports previous alcohol use. She reports that she does not use drugs.   Allergies: Other and Shellfish allergy    Current  Medications at time of admission:  Prior to Admission medications   Medication Sig Start Date End Date Taking? Authorizing Provider  fluticasone (FLONASE) 50 MCG/ACT nasal spray Place into both nostrils daily.    [provider]  loratadine (CLARITIN) 10 MG tablet Take 10 mg by mouth daily.    [provider]  metFORMIN (GLUCOPHAGE) 500 MG tablet Take 500 mg by mouth 2 (two) times daily with a meal.    [provider]  metroNIDAZOLE (FLAGYL) 500 MG tablet Take 500 mg by mouth 3 (three) times daily.    [provider]  Prenatal Vit-Fe Fumarate-FA (MULTIVITAMIN-PRENATAL) 27-0.8 MG TABS tablet Take 1 tablet by mouth daily at 12 noon.    [provider]     Review of Systems: Active FM Occ BH ctxs No LOF  / SROM  No bloody show   Physical Exam:  VS: Blood pressure 134/83, pulse 89, temperature 98 F (36.7 C), temperature source Oral, height 5\' 9"  (1.753 m), weight 100.5 kg, last menstrual period 02/08/2018. Per RN exam General: alert and oriented Heart: RRR Lungs: Clear lung fields Abdomen: Gravid, soft and non-tender, non-distended  Extremities: no edema  Genitalia / VE: Dilation: Closed Effacement (%): Thick Exam by:: Doree Barthel, RN  FHR: baseline rate 150-155 bpm / variability moderate / accelerations +15x15 / no decelerations TOCO: irregular  Assessment: 39+[redacted] weeks gestation Induction stage of labor for A2GDM, AMA Uterine fibroids GBS  positive FHR category 1   Plan:  1. Admit to [facility]   - Routine labor and delivery orders   - Pain management: planning epidural in active labor - notify CNM upon maternal request; plan for Fentanyl 50-100mg  every 1hr PRN if needed prior to epidural  - Induction method: Cytotec 34mcg buccal every 4-6 hours PRN - CBGs every 2 hrs - Light labor diet  2. GBS Positive  - Penicillin IVPB every 4 hrs 3. Postpartum:   - Breast feeding 4. Anticipate MOD: NSVD   Dr. Benjie Karvonen notified of  admission / plan of care  Lars Pinks, MSN, [facility]

## 2018-11-10 NOTE — Progress Notes (Addendum)
Patient ID: Vickie Anderson, female   DOB: 05/10/1980, 38 y.o.   MRN: [id number] Vickie Anderson is a 38 y.o. G2P0010 at [redacted]w[redacted]d by ultrasound admitted for IOL 2/2 GDM A2, uterine fibroids S/P Cytotec 50 mcg buccal x 2. Last dose at 0500. Called by RN to assess VB and FHT.  Subjective: Contractions painful since 0700, felt a "pop" followed by large gush of blood, ctx very frequent afterwards. Desires pain control now - epidural.   Objective: Vitals:   11/10/18 0022 11/10/18 0200 11/10/18 0503 11/10/18 0701  BP:   106/60 (!) 128/59  Pulse:   88 61  Resp:  16 16 16   Temp:   98.2 F (36.8 C)   TempSrc:   Oral   Weight: 100.5 kg     Height: 5\' 9"  (1.753 m)         FHT:  FHR: 140 bpm, variability: moderate,  accelerations:  Present,  decelerations:  Present occasional subtle late decels at & am, couple of mod. variables during night, not with occasional early decels UC:   irregular, every 1-3 minutes, runs of ctx, dystotic pattern SVE:   Dilation: 2.5 Effacement (%): 90 Station: -1 Exam by:: Haskel Khan, CNM Dark blood on bedding, no active bleeding, moderate clot (50cc) from posterior vagina No membranes palpated, lots of fetal hair noted.  Labs:   Recent Labs    11/10/18 0028  WBC 9.8  HGB 11.8*  HCT 35.6*  PLT 288  CBG (last 3)   1258 - 95 0302 - 128 0500 - 124   Assessment / Plan: G2P0010 at [redacted]w[redacted]d  Induction of labor due to uterine fibroids, GDM A2 stable on metformin  Labor: S/P Cytotec x 2 overnight, good response for cervical ripening. Concern for placental abruption with increased vaginal bleed this AM, but FHT cat 1 and 2 intermittent, bleeding is minimal at this time, and physiologic labor anticipated. Suspect SROM this AM, no meconium noted, unclear if bleed d/t rapid cervical change with SROM or partial abruption.   Will continue to monitor closely, discussed possible need for emergency cesarean if fetal distress develops or VB worsens which may indicate large  abruption. Patient voices understanding and agreement. Spouse currently en route to hospital. T&C x 2 units, second PIV placement  Preeclampsia:  no signs or symptoms of toxicity Fetal Wellbeing:  Category I and Category II Pain Control:  Epidural now I/D:  GBS neg Anticipated MOD:  guarded   Dr. Benjie Karvonen update with patient status and POC, readily available. Juliene Pina, CNM, MSN 11/10/2018, 8:19 AM

## 2018-11-10 NOTE — Lactation Note (Signed)
This note was copied from a baby's chart. Lactation Consultation Note  Patient Name: Vickie Anderson Today's Date: 11/10/2018 Reason for consult: Initial assessment;1st time breastfeeding;Term;Maternal endocrine disorder Type of Endocrine Disorder?: Diabetes P1, 85 hour female infant, mom with C/S delivery and hx GDM in pregnancy. This is mom's 4 time breastfeeding she breastfeed in L&D and infant been latching for 15 minutes each feeding. Nurse change one void diaper while in room. Mom taught back hand expression and colostrum present in breast. After a few attempts infant sustained latch, mom breastfeed infant on her left breast using football hold position, LC ask mom wait until infant mouth is wide, tongue down and bring infant chin first towards breast. Infant sustain latch and was in rthymitic pattern after first few attempts and infant was still breastfeeding after 22 minutes when LC left the room. Mom knows to breastfeed according hunger cues, 8 to 12 times within 24 hours and on demand. Mom knows to call Nurse or [facility] if she has any questions, concerns or need assistance with latching infant to breast. Parents with continue to do as much STS as possible. Reviewed Baby & Me book's Breastfeeding Basics.  Mom made aware of O/P services, breastfeeding support groups, community resources, and our phone # for post-discharge questions.   Maternal Data Formula Feeding for Exclusion: No Has patient been taught Hand Expression?: Yes Does the patient have breastfeeding experience prior to this delivery?: No  Feeding Feeding Type: Breast Fed  LATCH Score Latch: Repeated attempts needed to sustain latch, nipple held in mouth throughout feeding, stimulation needed to elicit sucking reflex.  Audible Swallowing: A few with stimulation  Type of Nipple: Everted at rest and after stimulation  Comfort (Breast/Nipple): Soft / non-tender  Hold (Positioning): Assistance needed to correctly  position infant at breast and maintain latch.  LATCH Score: 7  Interventions Interventions: Breast feeding basics reviewed;Breast compression;Assisted with latch;Adjust position;Skin to skin;Support pillows;Breast massage;Position options;Hand express;Expressed milk  Lactation Tools Discussed/Used WIC Program: No   Consult Status Consult Status: Follow-up Date: 11/11/18 Follow-up type: In-patient    [name] 11/10/2018, 11:42 PM

## 2018-11-10 NOTE — Anesthesia Procedure Notes (Signed)
Epidural Patient location during procedure: OB Start time: 11/10/2018 8:38 AM End time: 11/10/2018 8:42 AM  Staffing Anesthesiologist: Janeece Riggers, MD  Preanesthetic Checklist Completed: patient identified, site marked, surgical consent, pre-op evaluation, timeout performed, IV checked, risks and benefits discussed and monitors and equipment checked  Epidural Patient position: sitting Prep: site prepped and draped and DuraPrep Patient monitoring: continuous pulse ox and blood pressure Approach: midline Location: L3-L4 Injection technique: LOR air  Needle:  Needle type: Tuohy  Needle gauge: 17 G Needle length: 9 cm and 9 Needle insertion depth: 5 cm cm Catheter type: closed end flexible Catheter size: 19 Gauge Catheter at skin depth: 10 cm Test dose: negative  Assessment Events: blood not aspirated, injection not painful, no injection resistance, negative IV test and no paresthesia

## 2018-11-10 NOTE — Anesthesia Preprocedure Evaluation (Signed)
Anesthesia Evaluation  Patient identified by MRN, date of birth, ID band Patient awake    Reviewed: Allergy & Precautions, NPO status , Patient's Chart, lab work & pertinent test results  Airway Mallampati: III  TM Distance: >3 FB Neck ROM: Full    Dental no notable dental hx.    Pulmonary neg pulmonary ROS,    Pulmonary exam normal breath sounds clear to auscultation       Cardiovascular negative cardio ROS Normal cardiovascular exam Rhythm:Regular Rate:Normal     Neuro/Psych negative neurological ROS  negative psych ROS   GI/Hepatic negative GI ROS, Neg liver ROS,   Endo/Other  diabetes, GestationalMorbid obesity  Renal/GU negative Renal ROS  negative genitourinary   Musculoskeletal negative musculoskeletal ROS (+)   Abdominal   Peds negative pediatric ROS (+)  Hematology negative hematology ROS (+)   Anesthesia Other Findings   Reproductive/Obstetrics negative OB ROS                             Anesthesia Physical Anesthesia Plan  ASA: III  Anesthesia Plan: Epidural   Post-op Pain Management:    Induction:   PONV Risk Score and Plan:   Airway Management Planned:   Additional Equipment:   Intra-op Plan:   Post-operative Plan:   Informed Consent: I have reviewed the patients History and Physical, chart, labs and discussed the procedure including the risks, benefits and alternatives for the proposed anesthesia with the patient or authorized representative who has indicated his/her understanding and acceptance.     Dental Advisory Given  Plan Discussed with:   Anesthesia Plan Comments: (Labs checked- platelets confirmed with RN in room. Fetal heart tracing, per RN, reported to be stable enough for sitting procedure. Discussed epidural, and patient consents to the procedure:  included risk of possible headache,backache, failed block, allergic reaction, and nerve injury.  This patient was asked if she had any questions or concerns before the procedure started.)        Anesthesia Quick Evaluation

## 2018-11-10 NOTE — Progress Notes (Signed)
Patient ID: Vickie Anderson, female   DOB: 10-08-1980, 38 y.o.   MRN: [id number] S: Doing well, pain well controlled by  Epidural, no pelvic pressure, occasional trickling felt.  Has been rotating positions with RN help.    O: Vitals:   11/10/18 1031 11/10/18 1101 11/10/18 1131 11/10/18 1201  BP: 124/78 114/67 125/69 114/74  Pulse: 72 69 73 75  Resp: 16 16 16 16   Temp:      TempSrc:      SpO2:      Weight:      Height:         FHT:  FHR: 140 bpm, variability: moderate,  accelerations:  Abscent,  decelerations:  Present occasional early UC:   regular, every 2-3 minutes SVE:   Dilation: 2.5 Effacement (%): 90 Station: -1 Exam by:: Eddie Dibbles, CNM No change in cvx, large clot (100cc) removed from vagina.   A / P: Protracted latent phase and suspect abruption No change in cervix for past 4 hours, and given increased bleeding with hx of fibroids, augmentation not a safe option.  Total EBL to date 200 cc. Risk of C/S reviewed including infection, bleeding, poss need for blood transfusion and its risk,( HIV, hepatitis, fever/rash) injury to surrounding organ structures, internal scar tissue. All questions answered.  Fetal Wellbeing:  Category I Pain Control:  Epidural  Anticipated MOD:  Prep for C/S, Dr. Benjie Karvonen notified and en route.  Juliene Pina, CNM, MSN 11/10/2018, 12:46 PM

## 2018-11-10 NOTE — Transfer of Care (Signed)
Immediate Anesthesia Transfer of Care Note  Patient: Vickie Anderson  Procedure(s) Performed: CESAREAN SECTION (N/A Abdomen)  Patient Location: PACU  Anesthesia Type:Epidural  Level of Consciousness: awake, alert  and oriented  Airway & Oxygen Therapy: Patient Spontanous Breathing  Post-op Assessment: Report given to RN and Post -op Vital signs reviewed and stable  Post vital signs: Reviewed and stable  Last Vitals:  Vitals Value Taken Time  BP 126/67 11/10/18 1410  Temp    Pulse 88 11/10/18 1411  Resp 19 11/10/18 1411  SpO2 100 % 11/10/18 1411  Vitals shown include unvalidated device data.  Last Pain:  Vitals:   11/10/18 1410  TempSrc:   PainSc: (P) 0-No pain         Complications: No apparent anesthesia complications

## 2018-11-11 ENCOUNTER — Encounter (HOSPITAL_COMMUNITY): Payer: Self-pay | Admitting: Obstetrics & Gynecology

## 2018-11-11 DIAGNOSIS — O4590 Premature separation of placenta, unspecified, unspecified trimester: Secondary | ICD-10-CM | POA: Clinically undetermined

## 2018-11-11 LAB — CBC
HCT: 37.2 % (ref 36.0–46.0)
Hemoglobin: 11.8 g/dL — ABNORMAL LOW (ref 12.0–15.0)
MCH: 28.6 pg (ref 26.0–34.0)
MCHC: 31.7 g/dL (ref 30.0–36.0)
MCV: 90.1 fL (ref 80.0–100.0)
Platelets: 270 10*3/uL (ref 150–400)
RBC: 4.13 MIL/uL (ref 3.87–5.11)
RDW: 13.7 % (ref 11.5–15.5)
WBC: 17.8 10*3/uL — ABNORMAL HIGH (ref 4.0–10.5)
nRBC: 0 % (ref 0.0–0.2)

## 2018-11-11 LAB — ABO/RH: ABO/RH(D): A POS

## 2018-11-11 NOTE — Op Note (Signed)
11/10/2018 Procedure Note : Primary Low Transverse Cesarean Section   Retina K Deblanc  Indications: 39 wks, Labor induction for A2GDM. Intrapartum bleeding, likely placental abruption, cervix 3 cm dilated, remote from delivery  Pre-operative Diagnosis: bleeding during labor, possible abruption.   Post-operative Diagnosis: Placenta abruption   Surgeon: Azucena Fallen, MD  Assistants: Derrell Lolling, CNM  Anesthesia: epidural   Procedure Details:  The patient was seen in the Labor Room. The risks, benefits, complications, treatment options, and expected outcomes were discussed with the patient. The patient concurred with the proposed plan, giving informed consent. identified as Kae Heller and the procedure verified as C-Section Delivery. A Time Out was held and the above information confirmed. 2 gm Ancef given.  Epidural anesthesia was noted to be adequate, the patient was draped and prepped in the usual sterile manner, foley was already in place from labor room and draining urine well.  A pfannenstiel incision was made and carried down through the subcutaneous tissue to the fascia. Fascial incision was made and extended transversely. The fascia was separated from the underlying rectus tissue superiorly and inferiorly. The peritoneum was identified and entered. Peritoneal incision was extended longitudinally. Alexis-O retractor placed. The utero-vesical peritoneal reflection was incised transversely and the bladder flap was bluntly freed from the lower uterine segment. A low transverse uterine incision was made. Dark maroon blood clots expelled from the incision site. Cord was noted under fetal head. Baby was delivered cephalic without difficulty. Female infant with vigorous cry was born at 13.24 hours on 11/10/18. Apgar scores of 8 at one minute and 9 at five minutes. Delayed cord clamping done at 1 minute and baby handed to NICU team in attendance. Cord ph was not sent. Cord blood was obtained for  evaluation. The placenta was removed Intact and appeared normal. The uterine outline, tubes and ovaries appeared normal}. The uterine incision was closed with running locked sutures of 0Monocryl. A second imbricating layer sutured.   Hemostasis was observed. Alexis retractor removed. Peritoneal closure done with 2-0 Vicryl.  The fascia was then reapproximated with running sutures of 0Vicryl. The subcuticular closure was performed using 2-0plain gut. The skin was closed with 4-0Vicryl.   Instrument, sponge, and needle counts were correct prior the abdominal closure and were correct at the conclusion of the case.   Findings: Blood clots removed at amniotomy consistent with placental abruption. Female infant delivered cephalic from [facility] hysterotomy, Vigorous, Apgars 8, 9. Placenta to path. Normal tubes, ovaries. Anterior fibroid was not palpable.    Estimated Blood Loss: 100 mL   Total IV Fluids: 2200 ml LR   Urine Output: 200CC OF clear urine  Specimens: @ORSPECIMEN @   Complications: no complications  Disposition: PACU - hemodynamically stable.   Maternal Condition: stable   Baby condition / location:  Couplet care / Skin to Skin  Attending Attestation: I performed the procedure.   Signed: Surgeon(s): Azucena Fallen, MD

## 2018-11-11 NOTE — Progress Notes (Signed)
Patient ID: Vickie Anderson, female   DOB: 1980/06/11, 38 y.o.   MRN: [id number] Subjective: POD# 1 Live born female  Birth Weight: 8 lb 3.4 oz (3725 g) APGAR: 8, 9  Newborn Delivery   Birth date/time: 11/10/2018 13:24:00 Delivery type: C-Section, Low Transverse Trial of labor: Yes C-section categorization: Primary     Baby name: Dylan Delivering provider: MODY, VAISHALI   Feeding: breast  Pain control at delivery: Epidural   Reports feeling well.  Patient reports tolerating PO.   Breast symptoms: latching easily Pain controlled with  PO meds Denies HA/SOB/C/P/N/V/dizziness. Flatus minimal. She reports vaginal bleeding as normal, without clots.  She is ambulating, no void yet since Foley DCed at 0500     Objective:   VS:    Vitals:   11/10/18 2136 11/10/18 2222 11/11/18 0220 11/11/18 0542  BP: (!) 137/91 125/88 140/86 139/83  Pulse: 76 72 74 63  Resp: 16  (!) 78 18  Temp: 97.6 F (36.4 C)  98 F (36.7 C) 98 F (36.7 C)  TempSrc: Oral  Oral Oral  SpO2:   100% 100%  Weight:      Height:          Intake/Output Summary (Last 24 hours) at 11/11/2018 0929 Last data filed at 11/11/2018 0542 Gross per 24 hour  Intake 3119.44 ml  Output 2402 ml  Net 717.44 ml        Recent Labs    11/10/18 0028 11/11/18 0536  WBC 9.8 17.8*  HGB 11.8* 11.8*  HCT 35.6* 37.2  PLT 288 270     Blood type: --/--/A POS, A POS Performed at [facility], [address] [place], [place] [address]  [phone])  Rubella: Immune (01/15 0000)     Physical Exam:  General: alert, cooperative and no distress CV: Regular rate and rhythm Resp: clear Abdomen: soft, NT, moderate gas distention, hypotonic BS x 4Q Incision: clean, dry and intact Uterine Fundus: firm, U+1, nontender Lochia: minimal Ext: no edema, redness or tenderness in the calves or thighs      Assessment/Plan: 38 y.o.   POD# 1. 39                  Principal Problem:   Postpartum care following  cesarean delivery 8/7 Active Problems:   Encounter for planned induction of labor   Cesarean delivery delivered   Placental abruption affecting delivery Uterine fibroids Urinary retention - bladder scan and I&O x 1 now, encouraged frequent voids afterwards   Doing well, stable.              Advance diet as tolerated Encourage rest when baby rests Breastfeeding support Encourage to ambulate, warm fluids to promote bowel mobility Routine post-op care  Juliene Pina, CNM, MSN 11/11/2018, 9:29 AM

## 2018-11-11 NOTE — Anesthesia Postprocedure Evaluation (Signed)
Anesthesia Post Note  Patient: Vickie Anderson  Procedure(s) Performed: CESAREAN SECTION (N/A Abdomen)     Patient location during evaluation: Mother Baby Anesthesia Type: Epidural Level of consciousness: awake and alert Pain management: pain level controlled Vital Signs Assessment: post-procedure vital signs reviewed and stable Respiratory status: spontaneous breathing, nonlabored ventilation and respiratory function stable Cardiovascular status: stable Postop Assessment: no headache, no backache and epidural receding Anesthetic complications: no    Last Vitals:  Vitals:   11/11/18 0220 11/11/18 0542  BP: 140/86 139/83  Pulse: 74 63  Resp: (!) 78 18  Temp: 36.7 C 36.7 C  SpO2: 100% 100%    Last Pain:  Vitals:   11/11/18 0828  TempSrc:   PainSc: 0-No pain   Pain Goal:                Epidural/Spinal Function Cutaneous sensation: Normal sensation (11/11/18 0828), Patient able to flex knees: Yes (11/11/18 0828), Patient able to lift hips off bed: Yes (11/11/18 0828), Back pain beyond tenderness at insertion site: No (11/11/18 0828), Progressively worsening motor and/or sensory loss: No (11/11/18 0828), Bowel and/or bladder incontinence post epidural: No (11/11/18 1572)  [name]

## 2018-11-11 NOTE — Lactation Note (Addendum)
This note was copied from a baby's chart. Lactation Consultation Note  Patient Name: Vickie Anderson Today's Date: 11/11/2018  P1, 65 hour female infant, weight loss -4% Per Dad, infant had 3 voids and 2 stools today. Per mom, infant not been latching well has not been sustaining latch. Mom made 4 attempts to breastfeed infant, infant last latched at 5:30 pm for 10 minutes and mom last attempted  To breastfeed infant at 8:30 pm. Mom was given a 24 NS by Nurse earlier today, mom has large breast and nipples and when mom was given 24 mm NS infant sustained latch for 10 minutes at 5:30 pm. LC notice mom's breast not as compressible, LC had mom to do breast massage and stimulation and colostrum was present. Mom was excited to see the colostrum in breast, mom expressed small amount in 24 NS prior to latching infant to breast. Mom latched infant on left breast using football hold position, infant  sustained latch and breastfeed with few swallows observed for 25 minutes. Mom used her personal Medela DEBP from home and has pumped once she plans to start pumping every 3 hours for 15 minutes. .   Mom was given size 30 breast flange due large nipple size for a better fit.  Mom was using the DEBP when [name] left the room. Mom knows to call Nurse or [name] if she has any questions, concerns or need assistance with latching infant to breast.   Maternal Data    Feeding    LATCH Score                   Interventions    Lactation Tools Discussed/Used Pump Review: Setup, frequency, and cleaning;Milk Storage Initiated by:: [name] RN Date initiated:: 11/11/18   Consult Status      [name] 11/11/2018, 11:36 PM

## 2018-11-12 NOTE — Progress Notes (Signed)
POD #2  S/p C/S Fibroid uterus CLass A2 GDM  S: feels well no complaints. tol reg diet ? Regarding mgmt of fibroid  O: BP 128/84 (BP Location: Left Arm)   Pulse 73   Temp 98.2 F (36.8 C) (Oral)   Resp 18   Ht 5\' 9"  (1.753 m)   Wt 100.5 kg   LMP 02/08/2018   SpO2 100%   Breastfeeding Unknown   BMI 32.72 kg/m  Lungs clear to A Cor RRR  Breast soft large pendulous Abd uterus deviated to right 3 FB above umb  primary dressing d/c /intact  pad scant blood  ext tr edema. No calf tenderness  A/P: POD #2 s/p C/S  Fibroid uterus Class A2 GDM P) cont routine pp care.  WOB pp booklet given.   advised 2hr GTT 8 wk  Recommend baseline sono 3 month pp to assess fibroid( hx heavy cycle pre-preg)

## 2018-11-13 MED ORDER — SIMETHICONE 80 MG PO CHEW: 80.0000 mg | CHEWABLE_TABLET | ORAL | 0 refills | Status: DC | PRN

## 2018-11-13 MED ORDER — SENNOSIDES-DOCUSATE SODIUM 8.6-50 MG PO TABS: 2.0000 | ORAL_TABLET | ORAL | Status: DC

## 2018-11-13 MED ORDER — IBUPROFEN 800 MG PO TABS: 800.0000 mg | ORAL_TABLET | Freq: Three times a day (TID) | ORAL | 0 refills | Status: DC

## 2018-11-13 MED ORDER — COCONUT OIL OIL: 1.0000 "application " | TOPICAL_OIL | 0 refills | Status: DC | PRN

## 2018-11-13 MED ORDER — ACETAMINOPHEN 500 MG PO TABS: 1000.0000 mg | ORAL_TABLET | Freq: Four times a day (QID) | ORAL | 0 refills | Status: AC

## 2018-11-13 MED ORDER — OXYCODONE HCL 5 MG PO TABS: 5.0000 mg | ORAL_TABLET | ORAL | 0 refills | Status: DC | PRN

## 2018-11-13 NOTE — Lactation Note (Signed)
This note was copied from a baby's chart. Lactation Consultation Note  Patient Name: Vickie Anderson EOFHQ'[id number] Date: 11/13/2018 Reason for consult: Follow-up assessment;Difficult latch;Primapara;1st time breastfeeding;Infant weight loss;Term;Maternal endocrine disorder Type of Endocrine Disorder?: Diabetes  LC in to visit with P32 Mom of term baby at 28 hrs old.  Baby at 10% weight loss on day of discharge.  Weight check scheduled for tomorrow.    Mom sitting hunched over baby, with nipple shield on (24 mm) and baby dressed and swaddled in blankets.  Offered to assist as Mom didn't look comfortable.   Noticed Mom had a large diameter erect nipple.  Asked if she had tried breastfeeding baby without nipple shield.  Mom said once.  Sat Mom up with support at her back.  Undressed and unswaddled baby to provide STS at the breast.   Assisted Mom in latching baby without a nipple shield.  Showed FOB how to uncurl lower lip and pull gently on chin to widen baby's latch.  Baby sucking:swallowing 1:1 and Mom denies any discomfort.    Encouraged keeping baby STS and offering breast when baby cues.  If nipple shield is used, recommended Mom pump after breastfeeding to support a full milk supply.  Talked about follow-up with OP lactation later this week and Mom is wanting this. Request sent to Clinic.  Pediatrician weight check tomorrow.    Engorgement prevention and treatment reviewed.  Mom aware of OP lactation support available to her.  Encouraged to call prn  Feeding Feeding Type: Breast Fed  LATCH Score Latch: Grasps breast easily, tongue down, lips flanged, rhythmical sucking.  Audible Swallowing: Spontaneous and intermittent  Type of Nipple: Everted at rest and after stimulation  Comfort (Breast/Nipple): Soft / non-tender  Hold (Positioning): Assistance needed to correctly position infant at breast and maintain latch.  LATCH Score: 9  Interventions Interventions: Breast feeding  basics reviewed;Assisted with latch;Skin to skin;Breast massage;Hand express;Breast compression;Adjust position;Support pillows;Position options;DEBP  Lactation Tools Discussed/Used Tools: Pump Breast pump type: Double-Electric Breast Pump   Consult Status Consult Status: Complete Date: 11/13/18 Follow-up type: [place] 11/13/2018, 9:30 AM

## 2018-11-13 NOTE — Discharge Summary (Signed)
OB Discharge Summary  Patient Name: Vickie Anderson DOB: 1980/06/15 MRN: [id number]  Date of admission: 11/10/2018 Delivering provider: MODY, VAISHALI   Date of discharge: 11/13/2018  Admitting diagnosis: pregnancy Intrauterine pregnancy: [redacted]w[redacted]d     Secondary diagnosis:Principal Problem:   Postpartum care following cesarean delivery 8/7 Active Problems:   Encounter for planned induction of labor   Cesarean delivery delivered   Placental abruption affecting delivery  Additional problems:none     Discharge diagnosis:  Patient Active Problem List   Diagnosis Date Noted  . Postpartum care following cesarean delivery 8/7 11/11/2018  . Cesarean delivery delivered 11/11/2018  . Placental abruption affecting delivery 11/11/2018  . Encounter for planned induction of labor 11/10/2018  . Traumatic injury during pregnancy 09/15/2018                                                                Post partum procedures:none  Augmentation: Cytotec Pain control: Epidural  Complications: Placental Abruption   Hospital course:  Induction of Labor With Cesarean Section  38 y.o. yo G2P1011 at [redacted]w[redacted]d was admitted to the hospital 11/10/2018 for induction of labor. Patient had a labor course significant for cervical ripening x 2 doses of buccal Cytotec 50 mcg, then vaginal bleeding noted with spontaneous rupture of membranes. Patient did not progress in dilation despite contractions, Pitocin augmentation not a safe option due to continued vaginal bleeding with large clots. The patient went for cesarean section due to suspected placental abruption, and delivered a Viable infant,11/10/2018  Membrane Rupture Time/Date: 7:15 AM ,11/10/2018   Details of operation can be found in separate operative Note.  Patient had an uncomplicated postpartum course. She is ambulating, tolerating a regular diet, passing flatus, and urinating well.  Patient is discharged home in stable condition on 11/13/18.                                     Physical exam  Vitals:   11/11/18 1303 11/11/18 2138 11/12/18 0500 11/12/18 2300  BP: (!) 141/83 121/73 128/84 134/84  Pulse: 67 74 73 75  Resp: 17 18 18 17   Temp: 97.8 F (36.6 C) 97.7 F (36.5 C) 98.2 F (36.8 C) 98.6 F (37 C)  TempSrc: Oral Oral Oral Oral  SpO2:      Weight:      Height:       General: alert, cooperative and no distress Lochia: appropriate Uterine Fundus: firm Incision: Healing well with no significant drainage DVT Evaluation: No cords or calf tenderness. No significant calf/ankle edema. Labs: Lab Results  Component Value Date   WBC 17.8 (H) 11/11/2018   HGB 11.8 (L) 11/11/2018   HCT 37.2 11/11/2018   MCV 90.1 11/11/2018   PLT 270 11/11/2018   No flowsheet data found.   Discharge instruction: per After Visit Summary and "Baby and Me Booklet".  After Visit Meds:  Allergies as of 11/13/2018      Reactions   Other Shortness Of Breath   Tree  Nuts and Peanuts   Shellfish Allergy Diarrhea, Rash      Medication List    STOP taking these medications   metFORMIN 500 MG tablet Commonly known as: GLUCOPHAGE  TAKE these medications   acetaminophen 500 MG tablet Commonly known as: TYLENOL Take 2 tablets (1,000 mg total) by mouth every 6 (six) hours.   coconut oil Oil Apply 1 application topically as needed.   fluticasone 50 MCG/ACT nasal spray Commonly known as: FLONASE Place into both nostrils daily.   ibuprofen 800 MG tablet Commonly known as: ADVIL Take 1 tablet (800 mg total) by mouth every 8 (eight) hours.   loratadine 10 MG tablet Commonly known as: CLARITIN Take 10 mg by mouth daily.   multivitamin-prenatal 27-0.8 MG Tabs tablet Take 1 tablet by mouth daily at 12 noon.   oxyCODONE 5 MG immediate release tablet Commonly known as: Oxy IR/ROXICODONE Take 1-2 tablets (5-10 mg total) by mouth every 4 (four) hours as needed for moderate pain.   senna-docusate 8.6-50 MG tablet Commonly known as: Senokot-S Take 2  tablets by mouth daily. Start taking on: November 14, 2018   simethicone 80 MG chewable tablet Commonly known as: MYLICON Chew 1 tablet (80 mg total) by mouth as needed for flatulence.       Diet: routine diet  Activity: Advance as tolerated. Pelvic rest for 6 weeks.   Postpartum contraception: Not Discussed  Newborn Data: Live born female  Birth Weight: 8 lb 3.4 oz (3725 g) APGAR: 73, 9  Newborn Delivery   Birth date/time: 11/10/2018 13:24:00 Delivery type: C-Section, Low Transverse Trial of labor: Yes C-section categorization: Primary      named Dylan Baby Feeding: Breast Disposition:home with mother   Delivery Report:  Review the Delivery Report for details.    Follow up: Follow-up Information    Juliene Pina, CNM. Schedule an appointment as soon as possible for a visit in 6 week(s).   Specialty: Obstetrics and Gynecology Contact information: [place]  [id number] [phone]             Signed: Otilio Carpen, MSN 11/13/2018, 9:37 AM

## 2018-11-14 LAB — BPAM RBC
Blood Product Expiration Date: 202008312359
Blood Product Expiration Date: 202008312359
Unit Type and Rh: 6200
Unit Type and Rh: 6200

## 2018-11-14 LAB — TYPE AND SCREEN
ABO/RH(D): A POS
Antibody Screen: NEGATIVE
Unit division: 0
Unit division: 0

## 2019-01-31 ENCOUNTER — Other Ambulatory Visit: Payer: Self-pay

## 2019-01-31 DIAGNOSIS — Z20822 Contact with and (suspected) exposure to covid-19: Secondary | ICD-10-CM

## 2019-02-01 LAB — NOVEL CORONAVIRUS, NAA: SARS-CoV-2, NAA: NOT DETECTED

## 2019-05-31 ENCOUNTER — Ambulatory Visit: Payer: BC Managed Care – PPO | Attending: Internal Medicine

## 2019-05-31 DIAGNOSIS — Z23 Encounter for immunization: Secondary | ICD-10-CM | POA: Insufficient documentation

## 2019-05-31 NOTE — Progress Notes (Signed)
   Covid-19 Vaccination Clinic  Name:  Vickie Anderson    MRN: [id number] DOB: 1980-07-21  05/31/2019  [name] was observed post Covid-19 immunization for 15 minutes without incidence. She was provided with Vaccine Information Sheet and instruction to access the V-Safe system.   [name] was instructed to call 911 with any severe reactions post vaccine: Marland Kitchen Difficulty breathing  . Swelling of your face and throat  . A fast heartbeat  . A bad rash all over your body  . Dizziness and weakness    Immunizations Administered    Name Date Dose VIS Date Route   Pfizer COVID-19 Vaccine 05/31/2019 10:30 AM 0.3 mL 03/16/2019 Intramuscular   Manufacturer: [place]   Lot: [id number]   [place]: [id number]

## 2019-06-20 ENCOUNTER — Ambulatory Visit: Payer: BC Managed Care – PPO | Attending: Internal Medicine

## 2019-06-20 DIAGNOSIS — Z23 Encounter for immunization: Secondary | ICD-10-CM

## 2019-06-20 NOTE — Progress Notes (Signed)
   Covid-19 Vaccination Clinic  Name:  Vickie Anderson    MRN: [id number] DOB: 1980-05-31  06/20/2019  Vickie Anderson was observed post Covid-19 immunization for 15 minutes without incident. She was provided with Vaccine Information Sheet and instruction to access the V-Safe system.   Vickie Anderson was instructed to call 911 with any severe reactions post vaccine: Marland Kitchen Difficulty breathing  . Swelling of face and throat  . A fast heartbeat  . A bad rash all over body  . Dizziness and weakness   Immunizations Administered    Name Date Dose VIS Date Route   Pfizer COVID-19 Vaccine 06/20/2019 12:19 PM 0.3 mL 03/16/2019 Intramuscular   Manufacturer: [place]   Lot: [id number]   [place]: [id number]

## 2020-01-01 ENCOUNTER — Ambulatory Visit: Payer: BC Managed Care – PPO | Attending: Internal Medicine

## 2020-01-01 DIAGNOSIS — Z23 Encounter for immunization: Secondary | ICD-10-CM

## 2020-01-01 NOTE — Progress Notes (Signed)
   Covid-19 Vaccination Clinic  Name:  Vickie Anderson    MRN: [id number] DOB: 11/16/1980  01/01/2020  [name] was observed post Covid-19 immunization for 15 minutes without incident. She was provided with Vaccine Information Sheet and instruction to access the V-Safe system.   [name] was instructed to call 911 with any severe reactions post vaccine: Marland Kitchen Difficulty breathing  . Swelling of face and throat  . A fast heartbeat  . A bad rash all over body  . Dizziness and weakness

## 2020-02-04 ENCOUNTER — Other Ambulatory Visit: Payer: BC Managed Care – PPO

## 2020-02-04 DIAGNOSIS — Z20822 Contact with and (suspected) exposure to covid-19: Secondary | ICD-10-CM

## 2020-02-05 ENCOUNTER — Other Ambulatory Visit: Payer: BC Managed Care – PPO

## 2020-02-05 LAB — NOVEL CORONAVIRUS, NAA: SARS-CoV-2, NAA: NOT DETECTED

## 2020-02-05 LAB — SARS-COV-2, NAA 2 DAY TAT

## 2020-03-18 ENCOUNTER — Other Ambulatory Visit: Payer: BC Managed Care – PPO

## 2020-03-18 DIAGNOSIS — Z20822 Contact with and (suspected) exposure to covid-19: Secondary | ICD-10-CM

## 2020-03-19 LAB — NOVEL CORONAVIRUS, NAA: SARS-CoV-2, NAA: NOT DETECTED

## 2020-03-19 LAB — SARS-COV-2, NAA 2 DAY TAT

## 2020-03-25 ENCOUNTER — Other Ambulatory Visit: Payer: BC Managed Care – PPO

## 2020-07-28 ENCOUNTER — Ambulatory Visit: Payer: Self-pay | Attending: Internal Medicine

## 2020-07-28 DIAGNOSIS — Z20822 Contact with and (suspected) exposure to covid-19: Secondary | ICD-10-CM | POA: Insufficient documentation

## 2020-07-29 LAB — NOVEL CORONAVIRUS, NAA: SARS-CoV-2, NAA: NOT DETECTED

## 2020-07-29 LAB — SARS-COV-2, NAA 2 DAY TAT

## 2020-10-09 ENCOUNTER — Other Ambulatory Visit: Payer: Self-pay

## 2020-10-22 IMAGING — US US MFM OB LIMITED
1 series · 14 of 24 positions shown · non-contrast
Comparison: none

[Series 1: us mfm ob limited · 24 acquisitions, 14 frames shown]
[im 1/24]
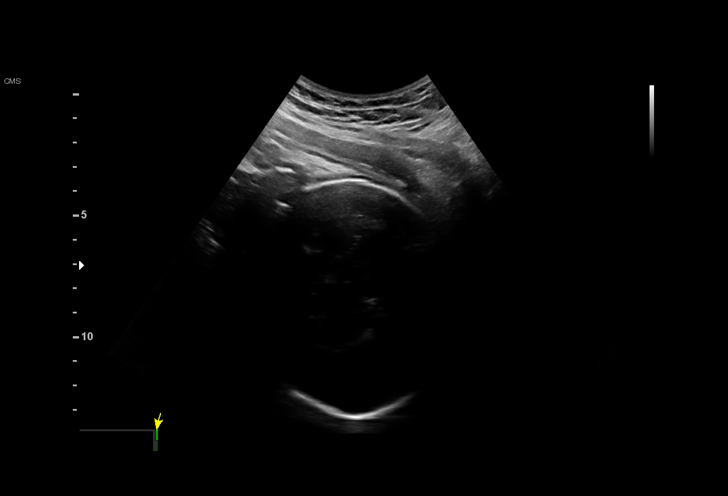
[im 3/24]
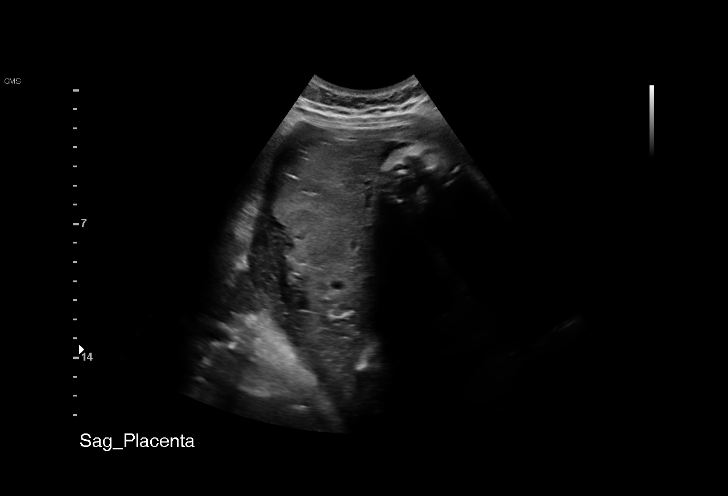
[im 5/24]
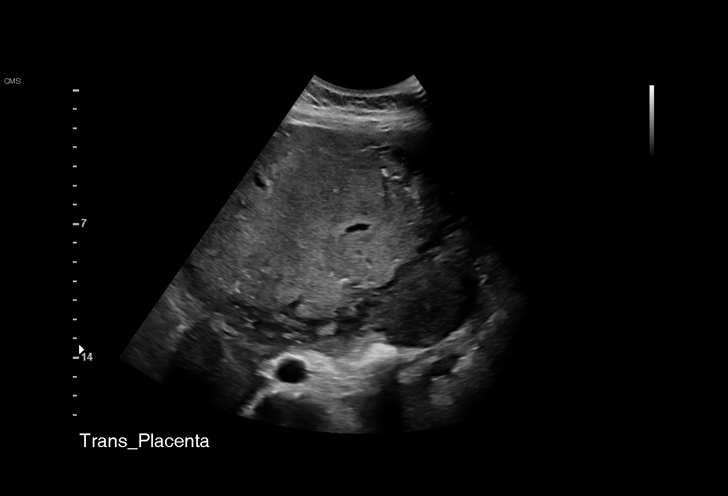
[im 7/24]
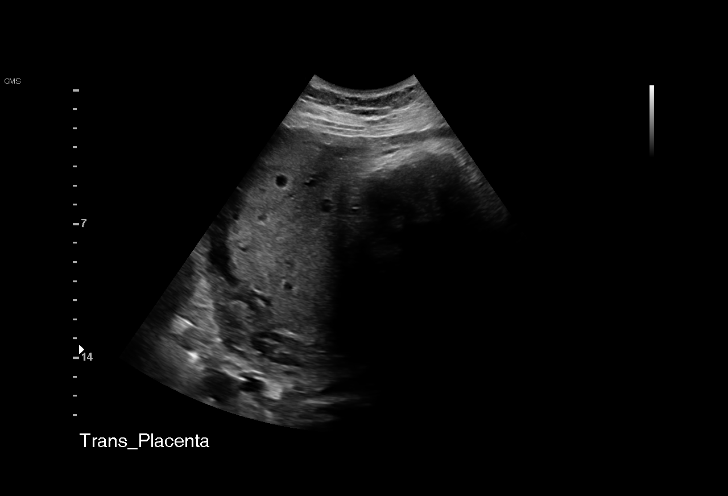
[im 8/24]
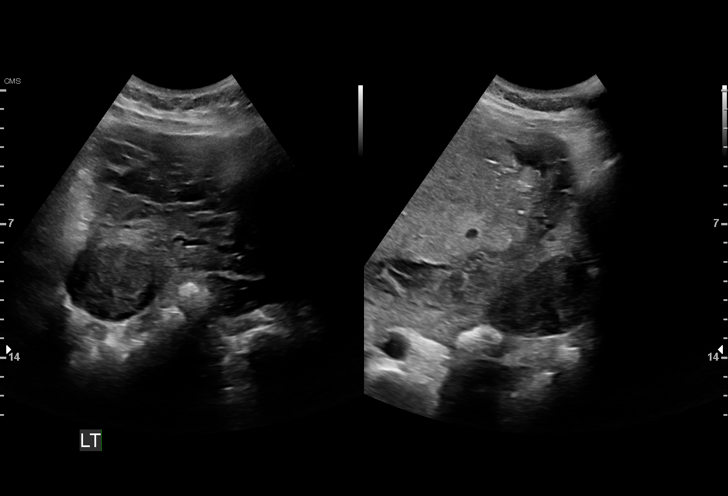
[im 10/24]
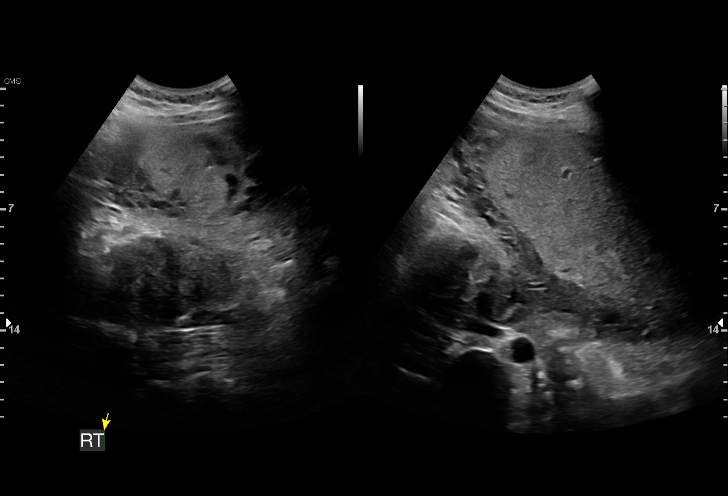
[im 12/24]
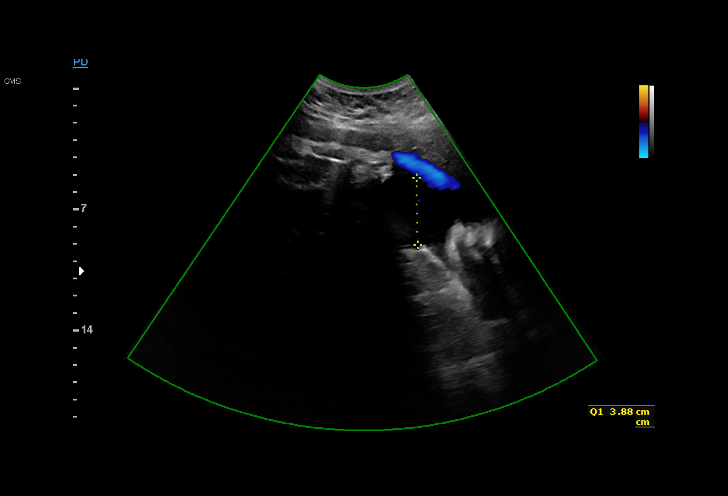
[im 13/24]
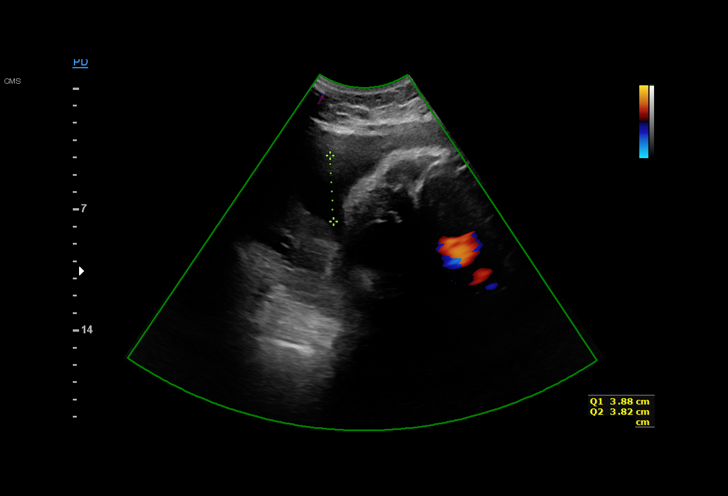
[im 15/24]
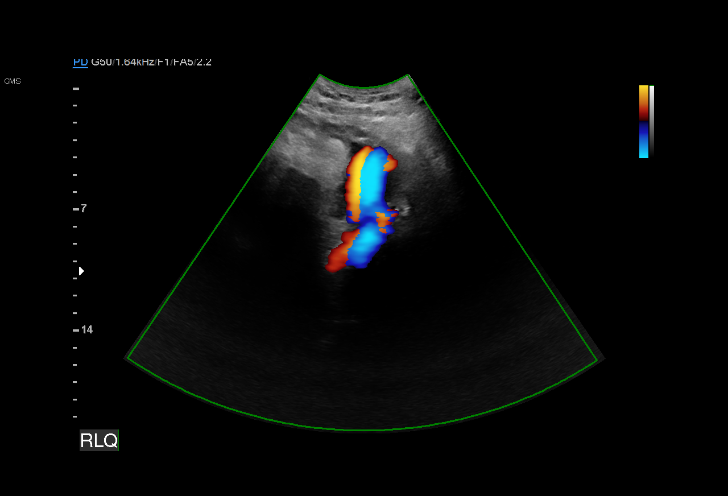
[im 17/24]
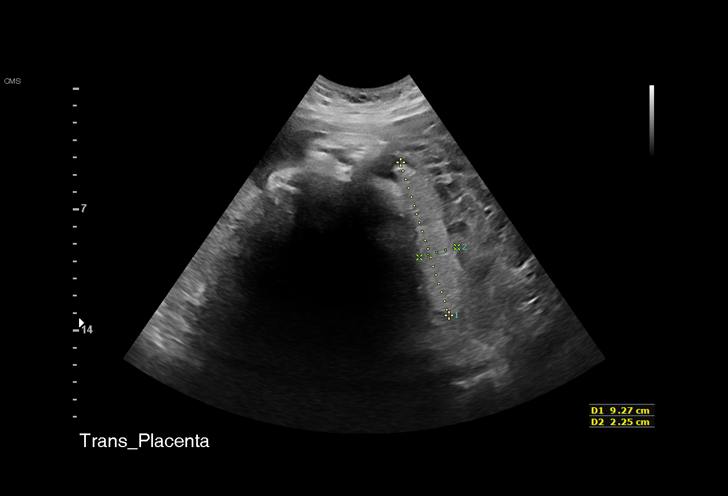
[im 19/24]
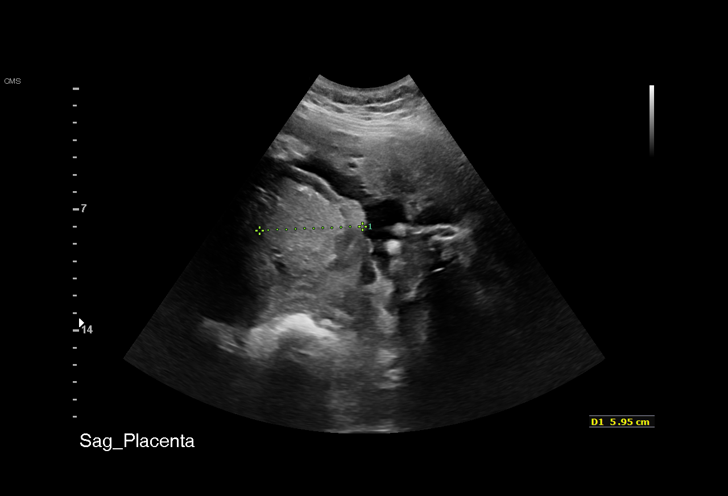
[im 20/24]
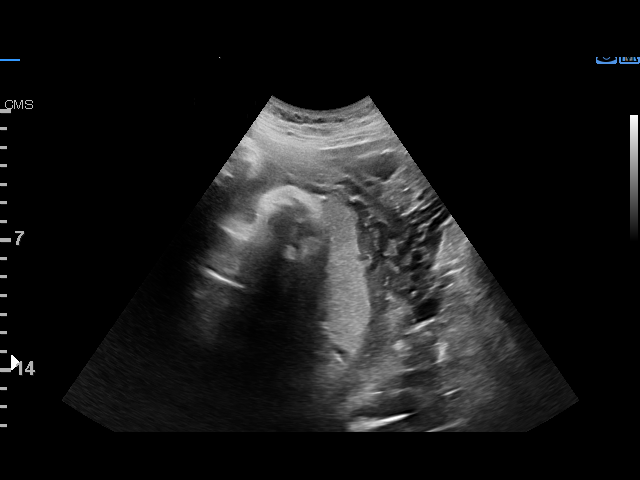
[im 22/24]
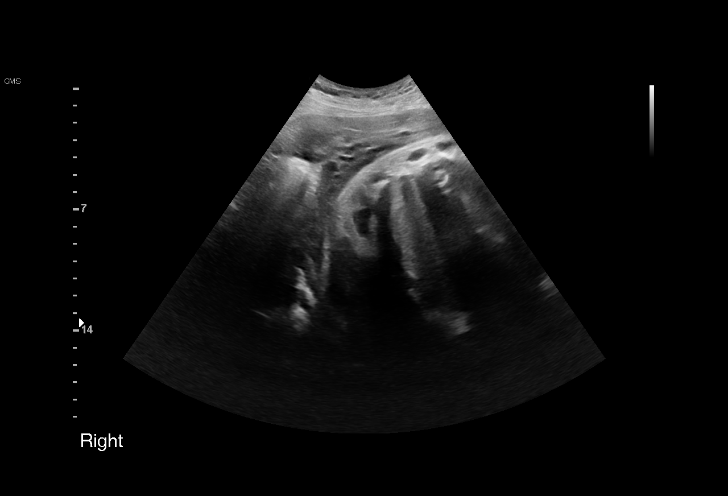
[im 24/24]
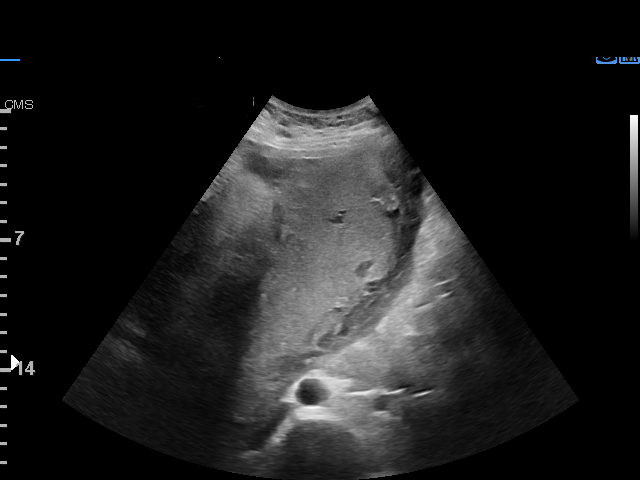

[14 of 24 positions shown; findings below may reference images not displayed]

1  US MFM OB LIMITED                    76815.01     ANTONIO DINIS BERTAO
 ----------------------------------------------------------------------

 ----------------------------------------------------------------------
Indications

  38 weeks gestation of pregnancy
  Uterine fibroids affecting pregnancy in third  O34.13,
  trimester, antepartum
  Gestational diabetes in pregnancy,
  controlled by oral hypoglycemic drugs
  (metformin)
  Advanced maternal age multigravida 35+,
  third trimester
 ----------------------------------------------------------------------
Fetal Evaluation

 Num Of Fetuses:         1
 Fetal Heart Rate(bpm):  158
 Cardiac Activity:       Observed
 Presentation:           Cephalic
 Placenta:               Posterior

 Amniotic Fluid
 AFI FV:      Within normal limits

 AFI Sum(cm)     %Tile       Largest Pocket(cm)
 10.23           28

 RUQ(cm)                     LUQ(cm)        LLQ(cm)

OB History

 Gravidity:    2         Term:   0        Prem:   0        SAB:   0
 TOP:          1       Ectopic:  0        Living: 0
Gestational Age

 LMP:           38w 2d        Date:  02/08/18                 EDD:   11/15/18
 Best:          38w 2d     Det. By:  LMP  (02/08/18)          EDD:   11/15/18
Cervix Uterus Adnexa

 Cervix
 Not visualized (advanced GA >99wks)

 Uterus
 Multiple fibroids noted, see table below.

 Left Ovary
 Not visualized.

 Right Ovary
 Not visualized.

 Cul De Sac
 No free fluid seen.

 Adnexa
 No abnormality visualized.
Myomas

  Site                     L(cm)      W(cm)      D(cm)      Location
  Left                     4.9        4.3        5.6        Intramural
  Right                    8.3        5.7        7.4        Intramural-
                                                            subplacental
 ----------------------------------------------------------------------

  Blood Flow                 RI        PI       Comments

 ----------------------------------------------------------------------
Impression

 opinion. On your office ultrasound, placental abruption was
 suspected. Patient has no high-risk factors including
 hypertension. She has gestational diabetes that is well-
 controlled on metformin. She does not give history of vaginal
 bleeding or abdominal pain.
 A limited ultrasound study was performed. Amniotic fluid is
 normal and good fetal activity is seen. Cephalic presentation.
 Placenta is posterior and appears normal. I could not identify
 placental abruption. Ultrasound has limitations in diagnosing
 placental abruption and can diagnose only about 50% of
 cases.
 Multiple myomas are seen including one subplacental.
 Myomas can increase the risk of placental abruption.
 Patient reports she is scheduled to undergo induction of labor
 at 39 weeks. In the absence of vaginal bleeding or abdominal
 pain, I do not recommend delivery before 39 weeks.

                 Chiota, Bizuneh

## 2022-03-05 ENCOUNTER — Other Ambulatory Visit: Payer: Self-pay | Admitting: Obstetrics & Gynecology

## 2022-03-17 ENCOUNTER — Other Ambulatory Visit: Payer: Self-pay

## 2022-03-17 ENCOUNTER — Encounter (HOSPITAL_BASED_OUTPATIENT_CLINIC_OR_DEPARTMENT_OTHER): Payer: Self-pay | Admitting: Obstetrics & Gynecology

## 2022-03-17 DIAGNOSIS — Z01812 Encounter for preprocedural laboratory examination: Secondary | ICD-10-CM | POA: Diagnosis present

## 2022-03-17 NOTE — Progress Notes (Signed)
Your procedure is scheduled on Thursday, 03/25/2022.  Report to [facility]   Call this number if you have problems the morning of surgery  [phone].   OUR ADDRESS IS [address].  WE ARE LOCATED IN [facility].  PLEASE BRING YOUR INSURANCE CARD AND PHOTO ID DAY OF SURGERY.  ONLY 2 PEOPLE ARE ALLOWED IN  WAITING  ROOM.                                      REMEMBER:  DO NOT EAT FOOD, CANDY GUM OR MINTS  AFTER MIDNIGHT THE NIGHT BEFORE YOUR SURGERY . YOU MAY HAVE CLEAR LIQUIDS FROM MIDNIGHT THE NIGHT BEFORE YOUR SURGERY UNTIL  4:30 AM. NO CLEAR LIQUIDS AFTER   4:30 AM DAY OF SURGERY.  YOU MAY  BRUSH YOUR TEETH MORNING OF SURGERY AND RINSE YOUR MOUTH OUT, NO CHEWING GUM CANDY OR MINTS.     CLEAR LIQUID DIET   Foods Allowed                                                                     Foods Excluded  Coffee and tea, regular and decaf                             liquids that you cannot  Plain Jell-O                                                                   see through such as: Fruit ices (not with fruit pulp)                                     milk, soups, orange juice  Plain  Popsicles                                    All solid food Carbonated beverages, regular and diet                                    Cranberry, grape and apple juices Sports drinks like Gatorade _____________________________________________________________________     TAKE ONLY THESE MEDICATIONS MORNING OF SURGERY: Azelastine nasal spray if needed    UP TO 4 VISITORS  MAY VISIT IN THE EXTENDED RECOVERY ROOM UNTIL 800 PM ONLY.  ONE  VISITOR AGE 42 AND OVER MAY SPEND THE NIGHT AND MUST BE IN EXTENDED RECOVERY ROOM NO LATER THAN 800 PM . YOUR DISCHARGE TIME AFTER YOU SPEND THE NIGHT IS 900 AM THE MORNING AFTER YOUR SURGERY.  YOU MAY PACK A SMALL OVERNIGHT BAG WITH TOILETRIES  FOR YOUR OVERNIGHT STAY IF YOU WISH.  YOUR  PRESCRIPTION MEDICATIONS WILL BE PROVIDED DURING [place].                                      DO NOT WEAR JEWERLY, MAKE UP. DO NOT WEAR LOTIONS, POWDERS, PERFUMES OR NAIL POLISH ON YOUR FINGERNAILS. TOENAIL POLISH IS OK TO WEAR. DO NOT SHAVE FOR 48 HOURS PRIOR TO DAY OF SURGERY. MEN MAY SHAVE FACE AND NECK. CONTACTS, GLASSES, OR DENTURES MAY NOT BE WORN TO SURGERY.  REMEMBER: NO SMOKING, DRUGS OR ALCOHOL FOR 24 HOURS BEFORE YOUR SURGERY.                                    [facility] IS NOT RESPONSIBLE  FOR ANY BELONGINGS.                                                                    Marland Kitchen           [facility] - Preparing for Surgery Before surgery, you can play an important role.  Because skin is not sterile, your skin needs to be as free of germs as possible.  You can reduce the number of germs on your skin by washing with CHG (chlorahexidine gluconate) soap before surgery.  CHG is an antiseptic cleaner which kills germs and bonds with the skin to continue killing germs even after washing. Please DO NOT use if you have an allergy to CHG or antibacterial soaps.  If your skin becomes reddened/irritated stop using the CHG and inform your nurse when you arrive at Short Stay. Do not shave (including legs and underarms) for at least 48 hours prior to the first CHG shower.  You may shave your face/neck. Please follow these instructions carefully:  1.  Shower with CHG Soap the night before surgery and the  morning of Surgery.  2.  If you choose to wash your hair, wash your hair first as usual with your  normal  shampoo.  3.  After you shampoo, rinse your hair and body thoroughly to remove the  shampoo.                                        4.  Use CHG as you would any other liquid soap.  You can apply chg directly  to the skin and wash , chg soap provided, night before and morning of your surgery.  5.  Apply the CHG Soap to your body ONLY FROM THE NECK DOWN.   Do not use on face/ open                            Wound or open sores. Avoid contact with eyes, ears mouth and genitals (private parts).                       Wash face,  Genitals (private parts) with your  normal soap.             6.  Wash thoroughly, paying special attention to the area where your surgery  will be performed.  7.  Thoroughly rinse your body with warm water from the neck down.  8.  DO NOT shower/wash with your normal soap after using and rinsing off  the CHG Soap.             9.  Pat yourself dry with a clean towel.            10.  Wear clean pajamas.            11.  Place clean sheets on your bed the night of your first shower and do not  sleep with pets. Day of Surgery : Do not apply any lotions/deodorants the morning of surgery.  Please wear clean clothes to the hospital/surgery center.  IF YOU HAVE ANY SKIN IRRITATION OR PROBLEMS WITH THE SURGICAL SOAP, PLEASE GET A BAR OF GOLD DIAL SOAP AND SHOWER THE NIGHT BEFORE YOUR SURGERY AND THE MORNING OF YOUR SURGERY. PLEASE LET THE NURSE KNOW MORNING OF YOUR SURGERY IF YOU HAD ANY PROBLEMS WITH THE SURGICAL SOAP.   ________________________________________________________________________                                                        QUESTIONS [facility] PRE OP NURSE PHONE [phone].

## 2022-03-17 NOTE — Progress Notes (Signed)
Spoke w/ via phone for pre-op interview---[facility] needs dos----urine pregnancy               Lab results------03/23/22 lab appt for cbc, bmp, type & screen COVID test -----patient states asymptomatic no test needed Arrive at -------0530 on Thursday, 03/25/22 NPO after MN NO Solid Food.  Clear liquids from MN until---0430 Med rec completed Medications to take morning of surgery -----Azelastine nasal spray prn Diabetic medication -----n/a Patient instructed no nail polish to be worn day of surgery Patient instructed to bring photo id and insurance card day of surgery Patient aware to have Driver (ride ) / caregiver    for 24 hours after surgery - boyfriend, Timothy Patient Special Instructions -----Bring CPAP & supplies on day of surgery. Extended / overnight stay instructions given. Pre-Op special Istructions -----none Patient verbalized understanding of instructions that were given at this phone interview. Patient denies shortness of breath, chest pain, fever, cough at this phone interview.

## 2022-03-23 ENCOUNTER — Encounter (HOSPITAL_COMMUNITY)
Admission: RE | Admit: 2022-03-23 | Discharge: 2022-03-23 | Disposition: A | Discharge status: Home / Self Care | Payer: BC Managed Care – PPO | Source: Ambulatory Visit | Attending: Obstetrics & Gynecology | Admitting: Obstetrics & Gynecology

## 2022-03-23 DIAGNOSIS — Z01812 Encounter for preprocedural laboratory examination: Secondary | ICD-10-CM | POA: Diagnosis not present

## 2022-03-23 LAB — BASIC METABOLIC PANEL
Anion gap: 6 (ref 5–15)
BUN: 12 mg/dL (ref 6–20)
CO2: 23 mmol/L (ref 22–32)
Calcium: 8.7 mg/dL — ABNORMAL LOW (ref 8.9–10.3)
Chloride: 110 mmol/L (ref 98–111)
Creatinine, Ser: 0.7 mg/dL (ref 0.44–1.00)
GFR, Estimated: >60 mL/min (ref >60)
Glucose, Bld: 107 mg/dL — ABNORMAL HIGH (ref 70–99)
Potassium: 3.7 mmol/L (ref 3.5–5.1)
Sodium: 139 mmol/L (ref 135–145)

## 2022-03-23 LAB — CBC
HCT: 41.2 % (ref 36.0–46.0)
Hemoglobin: 13.2 g/dL (ref 12.0–15.0)
MCH: 28.7 pg (ref 26.0–34.0)
MCHC: 32 g/dL (ref 30.0–36.0)
MCV: 89.6 fL (ref 80.0–100.0)
Platelets: 307 10*3/uL (ref 150–400)
RBC: 4.6 MIL/uL (ref 3.87–5.11)
RDW: 13.5 % (ref 11.5–15.5)
WBC: 7.6 10*3/uL (ref 4.0–10.5)
nRBC: 0 % (ref 0.0–0.2)

## 2022-03-24 NOTE — Anesthesia Preprocedure Evaluation (Addendum)
Anesthesia Evaluation  Patient identified by MRN, date of birth, ID band Patient awake    Reviewed: Allergy & Precautions, NPO status , Patient's Chart, lab work & pertinent test results  History of Anesthesia Complications Negative for: history of anesthetic complications  Airway Mallampati: III  TM Distance: >3 FB Neck ROM: Full    Dental no notable dental hx. (+) Dental Advisory Given   Pulmonary sleep apnea    Pulmonary exam normal        Cardiovascular negative cardio ROS Normal cardiovascular exam     Neuro/Psych negative neurological ROS     GI/Hepatic negative GI ROS, Neg liver ROS,,,  Endo/Other  negative endocrine ROSdiabetes    Renal/GU negative Renal ROS     Musculoskeletal negative musculoskeletal ROS (+)    Abdominal   Peds  Hematology negative hematology ROS (+)   Anesthesia Other Findings Fibroids, Abnormal Menses  Reproductive/Obstetrics                             Anesthesia Physical Anesthesia Plan  ASA: 2  Anesthesia Plan: General   Post-op Pain Management: Tylenol PO (pre-op)* and Toradol IV (intra-op)*   Induction: Intravenous  PONV Risk Score and Plan: 4 or greater and Ondansetron, Dexamethasone, Midazolam and Scopolamine patch - Pre-op  Airway Management Planned: Oral ETT  Additional Equipment:   Intra-op Plan:   Post-operative Plan: Extubation in OR  Informed Consent: I have reviewed the patients History and Physical, chart, labs and discussed the procedure including the risks, benefits and alternatives for the proposed anesthesia with the patient or authorized representative who has indicated his/her understanding and acceptance.     Dental advisory given  Plan Discussed with: Anesthesiologist and CRNA  Anesthesia Plan Comments:        Anesthesia Quick Evaluation

## 2022-03-24 NOTE — H&P (Signed)
Vickie Anderson is an 42 y.o. female.  menses are more frequent. has mirena IUD Jan'22. now periods are more frequent, heavier and painful. Does not desire future childbearing. Has Progestin IUD but not helping. Prior OCs not tolerated.   Office pelvic Sono Fibroid uterus, 10 x 8 x 6 cm, 166 cc, fibroids measured, 4 cm, 2.5 cm, 2.4 cm. EMS 11.3 mm, heterogeneous, cystic changes, possible polyp internal vascularity noted, IUD in normal location. left ovary with simple cyst, 3.6 x 3.6 cm, another complex cyst with internal debris 3.8 x 3.9 cm, and a resolving corpus luteum cyst, 1.7 x 1.4 x 1.3 cm. no ff noted.   G1P1001, C/section. Single, no STDs, nl Pap hx. No breast problems, nl current mammogram      Past Medical History:  Diagnosis Date   COVID-19    04/2019 and 04/2020 ( Patient took [place] in 04/2020.)   Family history of adverse reaction to anesthesia    Pt states that she believes her mom has had difficulty waking up from anesthesia in the past.   Fibroid    Gestational diabetes 2020   Headache    migraines r/t menstrual cycle   Sleep apnea    uses cpap nightly    Past Surgical History:  Procedure Laterality Date   CESAREAN SECTION N/A 11/10/2018   Procedure: CESAREAN SECTION;  Surgeon: Azucena Fallen, MD;  Location: [facility];  Service: Obstetrics;  Laterality: N/A;   DILATION AND CURETTAGE OF UTERUS  2004   LASIK  2018    Family History  Problem Relation Age of Onset   Diabetes Mother    Diabetes Father     Social History:  reports that she has never smoked. She has never used smokeless tobacco. She reports that she does not currently use alcohol. She reports that she does not use drugs.  Allergies:  Allergies  Allergen Reactions   Iodine Anaphylaxis   Other Shortness Of Breath    Tree  Nuts and Peanuts   Shellfish Allergy Diarrhea and Rash    No medications prior to admission.    Review of Systems  Height '5\' 9"'$  (1.753 m), weight 97.5 kg, not currently  breastfeeding. Physical Exam Physical exam:  A&O x 3, no acute distress. Pleasant HEENT neg, no thyromegaly Lungs CTA bilat CV RRR, S1S2 normal Abdo soft, non tender, non acute Extr no edema/ tenderness Pelvic  Cx closed, uterus 12 weeks fibroids. Adnexa nl.      Latest Ref Rng & Units 03/23/2022    9:48 AM   CBC  WBC 4.0 - 10.5 K/uL 7.6    Hemoglobin 12.0 - 15.0 g/dL 13.2    Hematocrit 36.0 - 46.0 % 41.2    Platelets 150 - 400 K/uL 307          Latest Ref Rng & Units 03/23/2022    9:48 AM  CMP  Glucose 70 - 99 mg/dL 107   BUN 6 - 20 mg/dL 12   Creatinine 0.44 - 1.00 mg/dL 0.70   Sodium 135 - 145 mmol/L 139   Potassium 3.5 - 5.1 mmol/L 3.7   Chloride 98 - 111 mmol/L 110   CO2 22 - 32 mmol/L 23   Calcium 8.9 - 10.3 mg/dL 8.7      Assessment/Plan: da vinci assisted total laparoscopic hysterectomy and bilateral salpingectomy.  For symptomatic uterine leiomyoma and endometrial polyps.  Failed OCs and Mirena IUD.Fibroids are slightly larger than sono in Jan'22. Option of H/scopy and polypectomy/  replace IUD, OCs/ endometrial ablation d/w pt. Pt wants hysterectomy due to likely 10 more yrs to menopause and worsening menses. Hysterectomy r/b/c dw pt incl risk of internal organs. One C/s hx., bladder adhesions, inability to have future pregnancies, understands and not planning  Risks/complications of surgery reviewed incl infection, bleeding, damage to internal organs including bladder, bowels, ureters, blood vessels, other risks from anesthesia, VTE and delayed complications of any surgery, complications in future surgery reviewed. Also discussed neonatal complications incl difficult delivery, laceration, vacuum assistance, TTN etc. Pt understands and agrees, all concerns addressed.    Vickie Anderson 03/24/2022, 1:29 PM

## 2022-03-25 ENCOUNTER — Ambulatory Visit (HOSPITAL_BASED_OUTPATIENT_CLINIC_OR_DEPARTMENT_OTHER): Payer: BC Managed Care – PPO | Admitting: Anesthesiology

## 2022-03-25 ENCOUNTER — Encounter (HOSPITAL_BASED_OUTPATIENT_CLINIC_OR_DEPARTMENT_OTHER): Payer: Self-pay | Admitting: Obstetrics & Gynecology

## 2022-03-25 ENCOUNTER — Other Ambulatory Visit: Payer: Self-pay

## 2022-03-25 ENCOUNTER — Ambulatory Visit (HOSPITAL_BASED_OUTPATIENT_CLINIC_OR_DEPARTMENT_OTHER)
Admission: RE | Admit: 2022-03-25 | Discharge: 2022-03-26 | Disposition: A | Discharge status: Home / Self Care | Payer: BC Managed Care – PPO | Attending: Obstetrics & Gynecology | Admitting: Obstetrics & Gynecology

## 2022-03-25 ENCOUNTER — Encounter (HOSPITAL_BASED_OUTPATIENT_CLINIC_OR_DEPARTMENT_OTHER)
Admission: RE | Disposition: A | Discharge status: Home / Self Care | Payer: Self-pay | Source: Home / Self Care | Attending: Obstetrics & Gynecology

## 2022-03-25 DIAGNOSIS — D259 Leiomyoma of uterus, unspecified: Secondary | ICD-10-CM | POA: Diagnosis present

## 2022-03-25 DIAGNOSIS — N888 Other specified noninflammatory disorders of cervix uteri: Secondary | ICD-10-CM | POA: Diagnosis not present

## 2022-03-25 DIAGNOSIS — Z01818 Encounter for other preprocedural examination: Secondary | ICD-10-CM

## 2022-03-25 DIAGNOSIS — G4730 Sleep apnea, unspecified: Secondary | ICD-10-CM | POA: Diagnosis not present

## 2022-03-25 DIAGNOSIS — Z90710 Acquired absence of both cervix and uterus: Secondary | ICD-10-CM | POA: Diagnosis present

## 2022-03-25 DIAGNOSIS — Z975 Presence of (intrauterine) contraceptive device: Secondary | ICD-10-CM | POA: Diagnosis not present

## 2022-03-25 HISTORY — DX: Headache, unspecified: R51.9

## 2022-03-25 HISTORY — DX: COVID-19: U07.1

## 2022-03-25 HISTORY — DX: Sleep apnea, unspecified: G47.30

## 2022-03-25 HISTORY — DX: Family history of other specified conditions: Z84.89

## 2022-03-25 HISTORY — PX: ROBOTIC ASSISTED LAPAROSCOPIC HYSTERECTOMY AND SALPINGECTOMY: SHX6379

## 2022-03-25 LAB — POCT PREGNANCY, URINE: Preg Test, Ur: NEGATIVE

## 2022-03-25 SURGERY — XI ROBOTIC ASSISTED LAPAROSCOPIC HYSTERECTOMY AND SALPINGECTOMY
Anesthesia: General | Site: Abdomen | Laterality: Bilateral

## 2022-03-25 MED ORDER — GLYCOPYRROLATE PF 0.2 MG/ML IJ SOSY
PREFILLED_SYRINGE | INTRAMUSCULAR | Status: AC
  Filled 2022-03-25: qty 1

## 2022-03-25 MED ORDER — KETOROLAC TROMETHAMINE 30 MG/ML IJ SOLN
INTRAMUSCULAR | Status: AC
  Filled 2022-03-25: qty 1

## 2022-03-25 MED ORDER — LACTATED RINGERS IV SOLN: INTRAVENOUS | Status: DC

## 2022-03-25 MED ORDER — ACETAMINOPHEN 500 MG PO TABS
ORAL_TABLET | ORAL | Status: AC
  Filled 2022-03-25: qty 2

## 2022-03-25 MED ORDER — ONDANSETRON HCL 4 MG/2ML IJ SOLN
INTRAMUSCULAR | Status: AC
  Filled 2022-03-25: qty 2

## 2022-03-25 MED ORDER — MENTHOL 3 MG MT LOZG: 1.0000 | LOZENGE | OROMUCOSAL | Status: DC | PRN

## 2022-03-25 MED ORDER — PROPOFOL 500 MG/50ML IV EMUL
INTRAVENOUS | Status: AC
  Filled 2022-03-25: qty 50

## 2022-03-25 MED ORDER — SIMETHICONE 80 MG PO CHEW
CHEWABLE_TABLET | ORAL | Status: AC
  Filled 2022-03-25: qty 1

## 2022-03-25 MED ORDER — ACETAMINOPHEN 500 MG PO TABS: 1000.0000 mg | ORAL_TABLET | Freq: Four times a day (QID) | ORAL | Status: DC

## 2022-03-25 MED ORDER — PHENYLEPHRINE 80 MCG/ML (10ML) SYRINGE FOR IV PUSH (FOR BLOOD PRESSURE SUPPORT)
PREFILLED_SYRINGE | INTRAVENOUS | Status: AC
  Filled 2022-03-25: qty 10

## 2022-03-25 MED ORDER — PROPOFOL 10 MG/ML IV BOLUS
INTRAVENOUS | Status: AC
  Filled 2022-03-25: qty 20

## 2022-03-25 MED ORDER — SIMETHICONE 80 MG PO CHEW
80.0000 mg | CHEWABLE_TABLET | Freq: Four times a day (QID) | ORAL | Status: DC | PRN
  Administered 2022-03-25: 80 mg via ORAL

## 2022-03-25 MED ORDER — ONDANSETRON HCL 4 MG PO TABS: 4.0000 mg | ORAL_TABLET | Freq: Four times a day (QID) | ORAL | 0 refills | Status: AC | PRN

## 2022-03-25 MED ORDER — ACETAMINOPHEN 500 MG PO TABS
1000.0000 mg | ORAL_TABLET | Freq: Once | ORAL | Status: AC
  Administered 2022-03-25: 1000 mg via ORAL

## 2022-03-25 MED ORDER — IBUPROFEN 200 MG PO TABS: 600.0000 mg | ORAL_TABLET | Freq: Four times a day (QID) | ORAL | Status: DC

## 2022-03-25 MED ORDER — ACETAMINOPHEN 500 MG PO TABS
1000.0000 mg | ORAL_TABLET | Freq: Four times a day (QID) | ORAL | Status: DC
  Administered 2022-03-25 – 2022-03-26 (×3): 1000 mg via ORAL

## 2022-03-25 MED ORDER — SCOPOLAMINE 1 MG/3DAYS TD PT72
1.0000 | MEDICATED_PATCH | TRANSDERMAL | Status: DC
  Administered 2022-03-25: 1.5 mg via TRANSDERMAL

## 2022-03-25 MED ORDER — OXYCODONE HCL 5 MG PO TABS
ORAL_TABLET | ORAL | Status: AC
  Filled 2022-03-25: qty 1

## 2022-03-25 MED ORDER — KETOROLAC TROMETHAMINE 30 MG/ML IJ SOLN
30.0000 mg | Freq: Four times a day (QID) | INTRAMUSCULAR | Status: DC
  Administered 2022-03-25 – 2022-03-26 (×2): 30 mg via INTRAVENOUS

## 2022-03-25 MED ORDER — IBUPROFEN 600 MG PO TABS: 600.0000 mg | ORAL_TABLET | Freq: Four times a day (QID) | ORAL | 0 refills | Status: AC

## 2022-03-25 MED ORDER — SODIUM CHLORIDE (PF) 0.9 % IJ SOLN
INTRAMUSCULAR | Status: AC
  Filled 2022-03-25: qty 10

## 2022-03-25 MED ORDER — ONDANSETRON HCL 4 MG PO TABS: 4.0000 mg | ORAL_TABLET | Freq: Four times a day (QID) | ORAL | Status: DC | PRN

## 2022-03-25 MED ORDER — SCOPOLAMINE 1 MG/3DAYS TD PT72
MEDICATED_PATCH | TRANSDERMAL | Status: AC
  Filled 2022-03-25: qty 1

## 2022-03-25 MED ORDER — MIDAZOLAM HCL 2 MG/2ML IJ SOLN
INTRAMUSCULAR | Status: DC | PRN
  Administered 2022-03-25: 2 mg via INTRAVENOUS

## 2022-03-25 MED ORDER — FENTANYL CITRATE (PF) 100 MCG/2ML IJ SOLN
INTRAMUSCULAR | Status: DC | PRN
  Administered 2022-03-25: 100 ug via INTRAVENOUS
  Administered 2022-03-25 (×2): 25 ug via INTRAVENOUS
  Administered 2022-03-25: 50 ug via INTRAVENOUS

## 2022-03-25 MED ORDER — PROMETHAZINE HCL 25 MG/ML IJ SOLN: 6.2500 mg | INTRAMUSCULAR | Status: DC | PRN

## 2022-03-25 MED ORDER — SODIUM CHLORIDE 0.9 % IR SOLN
Status: DC | PRN
  Administered 2022-03-25: 100 mL

## 2022-03-25 MED ORDER — FENTANYL CITRATE (PF) 100 MCG/2ML IJ SOLN
INTRAMUSCULAR | Status: AC
  Filled 2022-03-25: qty 2

## 2022-03-25 MED ORDER — AMISULPRIDE (ANTIEMETIC) 5 MG/2ML IV SOLN: 10.0000 mg | Freq: Once | INTRAVENOUS | Status: DC | PRN

## 2022-03-25 MED ORDER — DEXAMETHASONE SODIUM PHOSPHATE 10 MG/ML IJ SOLN
INTRAMUSCULAR | Status: AC
  Filled 2022-03-25: qty 1

## 2022-03-25 MED ORDER — POVIDONE-IODINE 10 % EX SWAB: 2.0000 | Freq: Once | CUTANEOUS | Status: DC

## 2022-03-25 MED ORDER — HYDROMORPHONE HCL 2 MG/ML IJ SOLN
INTRAMUSCULAR | Status: AC
  Filled 2022-03-25: qty 1

## 2022-03-25 MED ORDER — MIDAZOLAM HCL 2 MG/2ML IJ SOLN
INTRAMUSCULAR | Status: AC
  Filled 2022-03-25: qty 2

## 2022-03-25 MED ORDER — DIPHENHYDRAMINE HCL 50 MG/ML IJ SOLN
INTRAMUSCULAR | Status: DC | PRN
  Administered 2022-03-25: 12.5 mg via INTRAVENOUS

## 2022-03-25 MED ORDER — OXYCODONE HCL 5 MG PO TABS
5.0000 mg | ORAL_TABLET | ORAL | Status: DC | PRN
  Administered 2022-03-25 – 2022-03-26 (×5): 5 mg via ORAL

## 2022-03-25 MED ORDER — PROPOFOL 10 MG/ML IV BOLUS
INTRAVENOUS | Status: DC | PRN
  Administered 2022-03-25: 200 mg via INTRAVENOUS

## 2022-03-25 MED ORDER — GLYCOPYRROLATE PF 0.2 MG/ML IJ SOSY
PREFILLED_SYRINGE | INTRAMUSCULAR | Status: DC | PRN
  Administered 2022-03-25: .2 mg via INTRAVENOUS

## 2022-03-25 MED ORDER — SIMETHICONE 80 MG PO CHEW: 80.0000 mg | CHEWABLE_TABLET | Freq: Four times a day (QID) | ORAL | 0 refills | Status: AC | PRN

## 2022-03-25 MED ORDER — ONDANSETRON HCL 4 MG/2ML IJ SOLN: 4.0000 mg | Freq: Four times a day (QID) | INTRAMUSCULAR | Status: DC | PRN

## 2022-03-25 MED ORDER — ROCURONIUM BROMIDE 10 MG/ML (PF) SYRINGE
PREFILLED_SYRINGE | INTRAVENOUS | Status: DC | PRN
  Administered 2022-03-25: 10 mg via INTRAVENOUS
  Administered 2022-03-25: 100 mg via INTRAVENOUS

## 2022-03-25 MED ORDER — DIPHENHYDRAMINE HCL 50 MG/ML IJ SOLN
INTRAMUSCULAR | Status: AC
  Filled 2022-03-25: qty 1

## 2022-03-25 MED ORDER — DEXAMETHASONE SODIUM PHOSPHATE 10 MG/ML IJ SOLN
INTRAMUSCULAR | Status: DC | PRN
  Administered 2022-03-25: 10 mg via INTRAVENOUS

## 2022-03-25 MED ORDER — SODIUM CHLORIDE 0.9 % IV SOLN
INTRAVENOUS | Status: DC | PRN
  Administered 2022-03-25: 60 mL

## 2022-03-25 MED ORDER — FENTANYL CITRATE (PF) 250 MCG/5ML IJ SOLN
INTRAMUSCULAR | Status: AC
  Filled 2022-03-25: qty 5

## 2022-03-25 MED ORDER — LIDOCAINE 2% (20 MG/ML) 5 ML SYRINGE
INTRAMUSCULAR | Status: DC | PRN
  Administered 2022-03-25: 100 mg via INTRAVENOUS

## 2022-03-25 MED ORDER — FENTANYL CITRATE (PF) 100 MCG/2ML IJ SOLN
25.0000 ug | INTRAMUSCULAR | Status: DC | PRN
  Administered 2022-03-25 (×2): 25 ug via INTRAVENOUS

## 2022-03-25 MED ORDER — OXYCODONE HCL 5 MG PO TABS: 5.0000 mg | ORAL_TABLET | Freq: Four times a day (QID) | ORAL | 0 refills | Status: AC | PRN

## 2022-03-25 MED ORDER — KETOROLAC TROMETHAMINE 30 MG/ML IJ SOLN
INTRAMUSCULAR | Status: DC | PRN
  Administered 2022-03-25: 30 mg via INTRAVENOUS

## 2022-03-25 MED ORDER — PHENYLEPHRINE 80 MCG/ML (10ML) SYRINGE FOR IV PUSH (FOR BLOOD PRESSURE SUPPORT)
PREFILLED_SYRINGE | INTRAVENOUS | Status: DC | PRN
  Administered 2022-03-25: 160 ug via INTRAVENOUS

## 2022-03-25 MED ORDER — LIDOCAINE HCL (PF) 2 % IJ SOLN
INTRAMUSCULAR | Status: AC
  Filled 2022-03-25: qty 5

## 2022-03-25 MED ORDER — CEFAZOLIN SODIUM-DEXTROSE 2-4 GM/100ML-% IV SOLN
2.0000 g | INTRAVENOUS | Status: AC
  Administered 2022-03-25: 2 g via INTRAVENOUS

## 2022-03-25 MED ORDER — STERILE WATER FOR IRRIGATION IR SOLN
Status: DC | PRN
  Administered 2022-03-25: 500 mL

## 2022-03-25 MED ORDER — ONDANSETRON HCL 4 MG/2ML IJ SOLN
INTRAMUSCULAR | Status: DC | PRN
  Administered 2022-03-25: 4 mg via INTRAVENOUS

## 2022-03-25 MED ORDER — SUGAMMADEX SODIUM 200 MG/2ML IV SOLN
INTRAVENOUS | Status: DC | PRN
  Administered 2022-03-25: 200 mg via INTRAVENOUS

## 2022-03-25 MED ORDER — CEFAZOLIN SODIUM-DEXTROSE 2-4 GM/100ML-% IV SOLN
INTRAVENOUS | Status: AC
  Filled 2022-03-25: qty 100

## 2022-03-25 MED ORDER — ARTIFICIAL TEARS OPHTHALMIC OINT
TOPICAL_OINTMENT | OPHTHALMIC | Status: AC
  Filled 2022-03-25: qty 3.5

## 2022-03-25 MED ORDER — ROCURONIUM BROMIDE 10 MG/ML (PF) SYRINGE
PREFILLED_SYRINGE | INTRAVENOUS | Status: AC
  Filled 2022-03-25: qty 10

## 2022-03-25 SURGICAL SUPPLY — 64 items
APPLICATOR ARISTA FLEXITIP XL (MISCELLANEOUS) IMPLANT
BARRIER ADHS 3X4 INTERCEED (GAUZE/BANDAGES/DRESSINGS) IMPLANT
CATH FOLEY 3WAY  5CC 16FR (CATHETERS) ×1
CATH FOLEY 3WAY 5CC 16FR (CATHETERS) IMPLANT
COVER BACK TABLE 60X90IN (DRAPES) ×1 IMPLANT
COVER TIP SHEARS 8 DVNC (MISCELLANEOUS) ×1 IMPLANT
COVER TIP SHEARS 8MM DA VINCI (MISCELLANEOUS) ×1
DEFOGGER SCOPE WARMER CLEARIFY (MISCELLANEOUS) ×1 IMPLANT
DERMABOND ADVANCED .7 DNX12 (GAUZE/BANDAGES/DRESSINGS) ×1 IMPLANT
DRAPE ARM DVNC X/XI (DISPOSABLE) ×4 IMPLANT
DRAPE COLUMN DVNC XI (DISPOSABLE) ×1 IMPLANT
DRAPE DA VINCI XI ARM (DISPOSABLE) ×4
DRAPE DA VINCI XI COLUMN (DISPOSABLE) ×1
DRAPE UTILITY XL STRL (DRAPES) ×1 IMPLANT
DURAPREP 26ML APPLICATOR (WOUND CARE) ×1 IMPLANT
ELECT REM PT RETURN 9FT ADLT (ELECTROSURGICAL) ×1
ELECTRODE REM PT RTRN 9FT ADLT (ELECTROSURGICAL) ×1 IMPLANT
GAUZE 4X4 16PLY ~~LOC~~+RFID DBL (SPONGE) ×3 IMPLANT
GAUZE PETROLATUM 1 X8 (GAUZE/BANDAGES/DRESSINGS) IMPLANT
GLOVE BIO SURGEON STRL SZ7 (GLOVE) ×3 IMPLANT
GLOVE BIOGEL PI IND STRL 7.0 (GLOVE) ×5 IMPLANT
HEMOSTAT ARISTA ABSORB 3G PWDR (HEMOSTASIS) IMPLANT
HIBICLENS CHG 4% 4OZ BTL (MISCELLANEOUS) IMPLANT
HOLDER FOLEY CATH W/STRAP (MISCELLANEOUS) IMPLANT
IRRIG SUCT STRYKERFLOW 2 WTIP (MISCELLANEOUS) ×1
IRRIGATION SUCT STRKRFLW 2 WTP (MISCELLANEOUS) ×1 IMPLANT
IV NS 1000ML (IV SOLUTION) ×1
IV NS 1000ML BAXH (IV SOLUTION) IMPLANT
KIT PINK PAD W/HEAD ARE REST (MISCELLANEOUS) ×1
KIT PINK PAD W/HEAD ARM REST (MISCELLANEOUS) ×1 IMPLANT
KIT TURNOVER CYSTO (KITS) ×1 IMPLANT
LEGGING LITHOTOMY PAIR STRL (DRAPES) ×1 IMPLANT
OBTURATOR OPTICAL STANDARD 8MM (TROCAR) ×1
OBTURATOR OPTICAL STND 8 DVNC (TROCAR) ×1
OBTURATOR OPTICALSTD 8 DVNC (TROCAR) ×1 IMPLANT
OCCLUDER COLPOPNEUMO (BALLOONS) ×1 IMPLANT
PACK ROBOT WH (CUSTOM PROCEDURE TRAY) ×1 IMPLANT
PACK ROBOTIC GOWN (GOWN DISPOSABLE) ×1 IMPLANT
PAD OB MATERNITY 4.3X12.25 (PERSONAL CARE ITEMS) ×1 IMPLANT
PAD PREP 24X48 CUFFED NSTRL (MISCELLANEOUS) ×1 IMPLANT
PROTECTOR NERVE ULNAR (MISCELLANEOUS) ×1 IMPLANT
SEAL CANN UNIV 5-8 DVNC XI (MISCELLANEOUS) ×3 IMPLANT
SEAL XI 5MM-8MM UNIVERSAL (MISCELLANEOUS) ×3
SEALER VESSEL DA VINCI XI (MISCELLANEOUS) ×1
SEALER VESSEL EXT DVNC XI (MISCELLANEOUS) ×1 IMPLANT
SET IRRIG Y TYPE TUR BLADDER L (SET/KITS/TRAYS/PACK) IMPLANT
SET TRI-LUMEN FLTR TB AIRSEAL (TUBING) ×1 IMPLANT
SPIKE FLUID TRANSFER (MISCELLANEOUS) ×2 IMPLANT
SPONGE T-LAP 4X18 ~~LOC~~+RFID (SPONGE) ×1 IMPLANT
SUT VIC AB 4-0 PS2 18 (SUTURE) ×2 IMPLANT
SUT VICRYL 0 UR6 27IN ABS (SUTURE) ×1 IMPLANT
SUT VLOC 180 0 9IN  GS21 (SUTURE) ×2
SUT VLOC 180 0 9IN GS21 (SUTURE) ×1 IMPLANT
TIP RUMI ORANGE 6.7MMX12CM (TIP) IMPLANT
TIP UTERINE 5.1X6CM LAV DISP (MISCELLANEOUS) IMPLANT
TIP UTERINE 6.7X10CM GRN DISP (MISCELLANEOUS) IMPLANT
TIP UTERINE 6.7X6CM WHT DISP (MISCELLANEOUS) IMPLANT
TIP UTERINE 6.7X8CM BLUE DISP (MISCELLANEOUS) IMPLANT
TOWEL OR 17X26 10 PK STRL BLUE (TOWEL DISPOSABLE) ×1 IMPLANT
TRAY FOL W/BAG SLVR 16FR STRL (SET/KITS/TRAYS/PACK) IMPLANT
TRAY FOLEY W/BAG SLVR 16FR LF (SET/KITS/TRAYS/PACK)
TROCAR PORT AIRSEAL 5X120 (TROCAR) ×1 IMPLANT
WATER STERILE IRR 1000ML POUR (IV SOLUTION) ×1 IMPLANT
WATER STERILE IRR 500ML POUR (IV SOLUTION) IMPLANT

## 2022-03-25 NOTE — Progress Notes (Signed)
Patient ambulated to bathroom to try to void, only voided a small amount

## 2022-03-25 NOTE — Discharge Summary (Signed)
Physician Discharge Summary  Patient ID: Vickie Anderson MRN: [id number] DOB/AGE: 1980/04/08 41 y.o.  Admit date: 03/25/2022 Discharge date: 03/26/22 Admission Diagnoses: uterine fibroids   Discharge Diagnoses:  Principal Problem:   Uterine fibroid Active Problems:   S/P laparoscopic hysterectomy Bilateral salpingectomy  Discharged Condition: good  Hospital Course: uncomplicated surgery and post-op recovery  Discharge Exam: Blood pressure (!) 149/99, pulse 71, temperature 98 F (36.7 C), resp. rate 17, height '5\' 9"'$  (1.753 m), weight 101.5 kg, SpO2 99 %, not currently breastfeeding. General appearance: alert and cooperative Head: Normocephalic, without obvious abnormality Resp: clear to auscultation bilaterally Cardio: regular rate and rhythm, S1, S2 normal, no murmur, click, rub or gallop GI: soft, non-tender; bowel sounds normal; no masses,  no organomegaly Extremities: Homans sign is negative, no sign of DVT  Disposition: Discharge disposition: 01-Home or Self Care       Discharge Instructions     Call MD for:   Complete by: As directed    Heavy vaginal bleeding.   Call MD for:  difficulty breathing, headache or visual disturbances   Complete by: As directed    Call MD for:  extreme fatigue   Complete by: As directed    Call MD for:  hives   Complete by: As directed    Call MD for:  persistant dizziness or light-headedness   Complete by: As directed    Call MD for:  persistant nausea and vomiting   Complete by: As directed    Call MD for:  redness, tenderness, or signs of infection (pain, swelling, redness, odor or green/yellow discharge around incision site)   Complete by: As directed    Call MD for:  severe uncontrolled pain   Complete by: As directed    Call MD for:  temperature >100.4   Complete by: As directed    Diet - low sodium heart healthy   Complete by: As directed    Discharge wound care:   Complete by: As directed    Keep clean and dry    Driving Restrictions   Complete by: As directed    2 weeks   Increase activity slowly   Complete by: As directed    Lifting restrictions   Complete by: As directed    Less than 10 pounds for 6-8 weeks   Sexual Activity Restrictions   Complete by: As directed    6 weeks      Allergies as of 03/25/2022       Reactions   Iodine Anaphylaxis   Other Shortness Of Breath   Tree  Nuts and Peanuts   Shellfish Allergy Diarrhea, Rash        Medication List     TAKE these medications    acetaminophen 500 MG tablet Commonly known as: TYLENOL Take 2 tablets (1,000 mg total) by mouth every 6 (six) hours.   azelastine 0.1 % nasal spray Commonly known as: ASTELIN Place into both nostrils as needed for rhinitis. Use in each nostril as directed   fluticasone 50 MCG/ACT nasal spray Commonly known as: FLONASE Place into both nostrils as needed. sensimist   ibuprofen 600 MG tablet Commonly known as: ADVIL Take 1 tablet (600 mg total) by mouth every 6 (six) hours. Start taking on: March 26, 2022   levocetirizine 5 MG tablet Commonly known as: XYZAL Take 5 mg by mouth every evening.   montelukast 10 MG tablet Commonly known as: SINGULAIR Take 10 mg by mouth at bedtime.   ondansetron 4 MG  tablet Commonly known as: ZOFRAN Take 1 tablet (4 mg total) by mouth every 6 (six) hours as needed for nausea.   oxyCODONE 5 MG immediate release tablet Commonly known as: Oxy IR/ROXICODONE Take 1 tablet (5 mg total) by mouth every 6 (six) hours as needed for moderate pain.   simethicone 80 MG chewable tablet Commonly known as: MYLICON Chew 1 tablet (80 mg total) by mouth 4 (four) times daily as needed for flatulence.               Discharge Care Instructions  (From admission, onward)           Start     Ordered   03/25/22 0000  Discharge wound care:       Comments: Keep clean and dry   03/25/22 1130            Follow-up Information     Azucena Fallen, MD  Follow up on 04/14/2022.   Specialty: Obstetrics and Gynecology Why: please call to schedule Contact information: [address] [place] [address] [phone]                 Signed: [name]

## 2022-03-25 NOTE — Anesthesia Procedure Notes (Signed)
Procedure Name: Intubation Date/Time: 03/25/2022 7:45 AM  Performed by: Mechele Claude, CRNAPre-anesthesia Checklist: Patient identified, Emergency Drugs available, Suction available and Patient being monitored Patient Re-evaluated:Patient Re-evaluated prior to induction Oxygen Delivery Method: Circle system utilized Preoxygenation: Pre-oxygenation with 100% oxygen Induction Type: IV induction Ventilation: Mask ventilation without difficulty Laryngoscope Size: Mac and 3 Grade View: Grade I Tube type: Oral Tube size: 7.0 mm Number of attempts: 1 Airway Equipment and Method: Stylet and Oral airway Placement Confirmation: ETT inserted through vocal cords under direct vision, positive ETCO2 and breath sounds checked- equal and bilateral Secured at: 22 cm Tube secured with: Tape Dental Injury: Teeth and Oropharynx as per pre-operative assessment

## 2022-03-25 NOTE — Op Note (Signed)
03/25/22 Nyeemah Lormand 1981/02/03  Preoperative diagnosis: Symptomatic uterine fibroids  Postop diagnosis: Same Procedure: da Vinci robot assisted total laparoscopic hysterectomy and bilateral salpingectomy Anesthesia Gen. Endotracheal Surgeon: Dr. Azucena Fallen Assistant: Derrell Lolling  CNM  IV fluids: 800 cc LR EBL: 10 cc Urine output: 100 cc, clear, foley removed in OR after surgery Complications: none Pathology: Uterus with fibroids, cervix and both fallopian tubes Disposition: PACU, stable Findings: Fibroid uterus, right ovary with 2 simple cysts, 2 cm each, overall normal both ovaries and normal fallopian tubes. No adhesions. Normal ureteral peristalsis   Procedure:  Indication: --Menorrhagia, dysmenorrhea, enlarging fibroids. Failed medical management.   Complications of surgery including infection, bleeding, damage to internal organs and other surgery related problems including pneumonia, VTE reviewed and informed written consent was obtained.Patient was brought to the operating room with IV running. She received 2 gm Ancef . Underwent general anesthesia without difficulty and was given dorsal lithotomy position, prepped and draped in sterile fashion. Foley catheter was placed. Cervix was exposed with a speculum and anterior lip of the cervix was grasped with tenaculum. Uterus was sounded to 10 cm. Cervical os dilated to 25 F. Uterus was retroverted. A  # 10 Rumi tip and a medium Koh ring was assembled and the manipulator and entered in the uterine cavity and balloon was inflated to secure it in place. Koh ring was palpated again cervico-vaginal junction. Speculum was removed, tenaculum was left on the cervix.   Attention was focused on abdomen. Supraumbilical 9 mm vertical incision made with scalpel after injecting Ropivacaine, fascia dissected, grasped with Kocher's and incised, peritoneum grasped, incised, intraabdominal entry confirmed. Purse string stay stitch on 0-Vicryl taken on  fascia and Robot cannula with occluder introduced and Vicryl sutures secured. Pneumoperitoneum was begun. Laparoscope was introduced and the peritoneal cavity was evaluated.  Trendelenburg position given.  Port sited marked and injected with Ropivacaine. Two Robotic cannulas inserted on right side and one on the left side under vision and assist port Airseal on the left.  Robot was docked from the right. Through Arm 1 vessel sealer, arm 2 laparoscope, arm 3 endoscissors, arm 4 prograsp introduced and advanced under vision.   Dr. Benjie Karvonen scrubbed out and went for surgical console.  Uterus was deviated to the patient's right. The left fallopian tube grasped and mesosalpinx desiccated and cut, followed by left utero-ovarian ligament and left Round ligament which was desiccated and cut. Anterior bladder broad ligament was opened and incised. Posterior broad ligament incised up to the uterosacral ligament and left uterine vessels skeletonized. Uterus was deviated to the left and right fallopian tube grasped and mesosalpinx desiccated and incised followed by right utero-ovarian ligament and right Round ligament. Anterior broad ligament was incised to create bladder flap, bladder was pushed away by blunt and sharp dissection with excellent hemostasis. Posterior pedunculated myoma had adhesions to right ovarian fossa. Myoma was elevated and adhesions desiccated and cut with scissors. Koh ring impression at cervicovaginal junction was seen well anteriorly. Right posterior broad ligament dissected, right Uterine vessels skeletonized. Right uterine vessels were desiccated and cut. Uterus was deviated to the right and the left uterine vessels were desiccated and cut. Vaginal occluder was inflated. Colpotomy was begun starting from midline anteriorly coming to the left and right and then circumferentially staying above the uterosacral ligaments posteriorly. Uterus with fibroids, cervix, both tubes were pulled out of the vaginal  opening and vaginal occluder reinserted to maintain pneumoperitoneum.  Vaginal cut edges were evaluated for hemostasis which was  excellent. Irrigation was performed pedicles appeared dry. Robotic instrument switched for needle driver in arm 3.  0-V-Lock was used for suturing vaginal cuff in running fashion from right to left and a second layer from left to right. Cuff closed without complications. Vaginal exam by assistant noted to have excellent closure and no active bleeding.  Irrigation was performed, hemostasis noted. All instruments were removed. Robot was undocked. Patient was made supine. Lap'scope was reintroduced, hemostasis was excellent. All cannulas were removed under vision. The stay sutures at the fascia tied together with excellent fascial closure. Skin approximated with subcuticular stitches on 4-0 Vicryl. Dermabond was applied. Foley removed.   All counts were correct x2.  No complications. Patient brought to the PACU stable, extubated.   I was the surgeon for entire case --Daryl Eastern MD  [facility]

## 2022-03-25 NOTE — Anesthesia Postprocedure Evaluation (Signed)
Anesthesia Post Note  Patient: Vickie Anderson  Procedure(s) Performed: XI ROBOTIC ASSISTED LAPAROSCOPIC HYSTERECTOMY AND SALPINGECTOMY (Bilateral: Abdomen)     Patient location during evaluation: PACU Anesthesia Type: General Level of consciousness: sedated Pain management: pain level controlled Vital Signs Assessment: post-procedure vital signs reviewed and stable Respiratory status: spontaneous breathing and respiratory function stable Cardiovascular status: stable Postop Assessment: no apparent nausea or vomiting Anesthetic complications: no  No notable events documented.  Last Vitals:  Vitals:   03/25/22 1030 03/25/22 1045  BP: (!) 142/80 (!) 141/79  Pulse: 73 71  Resp: 15 17  Temp: 37 C   SpO2: 97% 97%    Last Pain:  Vitals:   03/25/22 1045  TempSrc:   PainSc: [place]

## 2022-03-25 NOTE — Transfer of Care (Signed)
Immediate Anesthesia Transfer of Care Note  Patient: Vickie Anderson  Procedure(s) Performed: XI ROBOTIC ASSISTED LAPAROSCOPIC HYSTERECTOMY AND SALPINGECTOMY (Bilateral: Abdomen)  Patient Location: PACU  Anesthesia Type:General  Level of Consciousness: drowsy and patient cooperative  Airway & Oxygen Therapy: Patient Spontanous Breathing  Post-op Assessment: Report given to RN and Post -op Vital signs reviewed and stable  Post vital signs: Reviewed and stable  Last Vitals:  Vitals Value Taken Time  BP 154/75 03/25/22 0957  Temp    Pulse 76 03/25/22 1000  Resp 16 03/25/22 1000  SpO2 98 % 03/25/22 1000  Vitals shown include unvalidated device data.  Last Pain:  Vitals:   03/25/22 0601  TempSrc: Oral  PainSc: 0-No pain      Patients Stated Pain Goal: 4 (34/35/68 6168)  Complications: No notable events documented.

## 2022-03-26 ENCOUNTER — Encounter (HOSPITAL_BASED_OUTPATIENT_CLINIC_OR_DEPARTMENT_OTHER): Payer: Self-pay | Admitting: Obstetrics & Gynecology

## 2022-03-26 DIAGNOSIS — D259 Leiomyoma of uterus, unspecified: Secondary | ICD-10-CM | POA: Diagnosis not present

## 2022-03-26 LAB — SURGICAL PATHOLOGY

## 2022-03-26 MED ORDER — OXYCODONE HCL 5 MG PO TABS
ORAL_TABLET | ORAL | Status: AC
  Filled 2022-03-26: qty 1

## 2022-03-26 MED ORDER — ACETAMINOPHEN 500 MG PO TABS
ORAL_TABLET | ORAL | Status: AC
  Filled 2022-03-26: qty 2

## 2022-03-26 MED ORDER — KETOROLAC TROMETHAMINE 30 MG/ML IJ SOLN
INTRAMUSCULAR | Status: AC
  Filled 2022-03-26: qty 1

## 2022-03-26 NOTE — Progress Notes (Signed)
1 Day Post-Op Procedure(s) (LRB): XI ROBOTIC ASSISTED LAPAROSCOPIC HYSTERECTOMY AND SALPINGECTOMY (Bilateral)  Subjective: Patient reports tolerating PO, + flatus, and no problems voiding.    Objective: I have reviewed patient's vital signs, intake and output, and medications.  General: alert and cooperative Resp: clear to auscultation bilaterally and normal percussion bilaterally Cardio: regular rate and rhythm, S1, S2 normal, no murmur, click, rub or gallop GI: soft, non-tender; bowel sounds normal; no masses,  no organomegaly Extremities: extremities normal, atraumatic, no cyanosis or edema and Homans sign is negative, no sign of DVT Vaginal Bleeding: none  Assessment: s/p Procedure(s) with comments: XI ROBOTIC ASSISTED LAPAROSCOPIC HYSTERECTOMY AND SALPINGECTOMY (Bilateral) : stable, progressing well, tolerating diet, and ready for discharge   Plan: Advance diet Encourage ambulation Advance to PO medication Discharge home  LOS: 0 days  OR findings and post op care dw pt. F/up 3 wks   [name], MD 03/26/2022, 8:36 AM

## 2022-03-27 LAB — TYPE AND SCREEN
ABO/RH(D): A POS
Antibody Screen: POSITIVE
Unit division: 0
Unit division: 0

## 2022-03-27 LAB — BPAM RBC
Blood Product Expiration Date: 202401162359
Blood Product Expiration Date: 202401162359
Unit Type and Rh: 6200
Unit Type and Rh: 6200

## 2022-03-30 ENCOUNTER — Other Ambulatory Visit: Payer: Self-pay

## 2022-03-30 ENCOUNTER — Emergency Department (HOSPITAL_BASED_OUTPATIENT_CLINIC_OR_DEPARTMENT_OTHER): Payer: BC Managed Care – PPO | Admitting: Radiology

## 2022-03-30 ENCOUNTER — Encounter (HOSPITAL_BASED_OUTPATIENT_CLINIC_OR_DEPARTMENT_OTHER): Payer: Self-pay

## 2022-03-30 ENCOUNTER — Emergency Department (HOSPITAL_BASED_OUTPATIENT_CLINIC_OR_DEPARTMENT_OTHER)
Admission: EM | Admit: 2022-03-30 | Discharge: 2022-03-31 | Discharge status: Against Medical Advice | Payer: BC Managed Care – PPO | Attending: Emergency Medicine | Admitting: Emergency Medicine

## 2022-03-30 DIAGNOSIS — R079 Chest pain, unspecified: Secondary | ICD-10-CM | POA: Insufficient documentation

## 2022-03-30 DIAGNOSIS — Z5321 Procedure and treatment not carried out due to patient leaving prior to being seen by health care provider: Secondary | ICD-10-CM | POA: Insufficient documentation

## 2022-03-30 LAB — CBC
HCT: 40.3 % (ref 36.0–46.0)
Hemoglobin: 13.3 g/dL (ref 12.0–15.0)
MCH: 28.7 pg (ref 26.0–34.0)
MCHC: 33 g/dL (ref 30.0–36.0)
MCV: 87 fL (ref 80.0–100.0)
Platelets: 324 10*3/uL (ref 150–400)
RBC: 4.63 MIL/uL (ref 3.87–5.11)
RDW: 13.3 % (ref 11.5–15.5)
WBC: 11.4 10*3/uL — ABNORMAL HIGH (ref 4.0–10.5)
nRBC: 0 % (ref 0.0–0.2)

## 2022-03-30 LAB — BASIC METABOLIC PANEL
Anion gap: 10 (ref 5–15)
BUN: 16 mg/dL (ref 6–20)
CO2: 24 mmol/L (ref 22–32)
Calcium: 8.9 mg/dL (ref 8.9–10.3)
Chloride: 104 mmol/L (ref 98–111)
Creatinine, Ser: 0.65 mg/dL (ref 0.44–1.00)
GFR, Estimated: >60 mL/min (ref >60)
Glucose, Bld: 109 mg/dL — ABNORMAL HIGH (ref 70–99)
Potassium: 3.4 mmol/L — ABNORMAL LOW (ref 3.5–5.1)
Sodium: 138 mmol/L (ref 135–145)

## 2022-03-30 LAB — TROPONIN I (HIGH SENSITIVITY): Troponin I (High Sensitivity): <2 ng/L (ref <18)

## 2022-03-30 NOTE — ED Triage Notes (Signed)
Patient here POV from Home.  Endorses Onset of CP that began 30 minutes ago. Mostly oriented to Back and Radiates to mid Chest. Hysterectomy 5 Days ago with no Known Complications.   NAD Noted During Triage. A&Ox4. GCS 15. Ambulatory.

## 2022-08-23 ENCOUNTER — Other Ambulatory Visit: Payer: Self-pay | Admitting: Physician Assistant

## 2022-08-23 ENCOUNTER — Ambulatory Visit (INDEPENDENT_AMBULATORY_CARE_PROVIDER_SITE_OTHER): Payer: BC Managed Care – PPO

## 2022-08-23 DIAGNOSIS — R1013 Epigastric pain: Secondary | ICD-10-CM

## 2022-09-02 ENCOUNTER — Ambulatory Visit: Payer: Self-pay | Admitting: General Surgery

## 2022-09-02 DIAGNOSIS — K8020 Calculus of gallbladder without cholecystitis without obstruction: Secondary | ICD-10-CM

## 2022-10-01 ENCOUNTER — Other Ambulatory Visit: Payer: Self-pay | Admitting: General Surgery
